# Patient Record
Sex: Male | Born: 1950 | Race: White | Hispanic: No | Marital: Single | Smoking: Former smoker
Health system: Southern US, Community
[De-identification: ages and names within clinical notes are randomized; demographics above are authoritative.]

## PROBLEM LIST (undated history)

## (undated) DIAGNOSIS — E782 Mixed hyperlipidemia: Secondary | ICD-10-CM

## (undated) DIAGNOSIS — R0989 Other specified symptoms and signs involving the circulatory and respiratory systems: Secondary | ICD-10-CM

## (undated) DIAGNOSIS — E559 Vitamin D deficiency, unspecified: Secondary | ICD-10-CM

## (undated) HISTORY — DX: Vitamin D deficiency, unspecified: E55.9

## (undated) HISTORY — PX: ORIF CLAVICLE FRACTURE: SUR924

## (undated) HISTORY — DX: Other specified symptoms and signs involving the circulatory and respiratory systems: R09.89

## (undated) HISTORY — DX: Mixed hyperlipidemia: E78.2

---
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
## 1979-01-06 HISTORY — PX: APPENDECTOMY: SHX54

## 2000-07-21 ENCOUNTER — Encounter: Payer: Self-pay | Admitting: Internal Medicine

## 2000-07-21 ENCOUNTER — Ambulatory Visit (HOSPITAL_COMMUNITY): Admission: RE | Admit: 2000-07-21 | Discharge: 2000-07-21 | Payer: Self-pay | Admitting: Internal Medicine

## 2003-09-27 ENCOUNTER — Emergency Department (HOSPITAL_COMMUNITY): Admission: EM | Admit: 2003-09-27 | Discharge: 2003-09-27 | Payer: Self-pay | Admitting: *Deleted

## 2005-07-08 IMAGING — CT CT ABDOMEN W/O CM
1 series · 15 of 32 positions shown, 19 images · IV contrast (agent unspecified)
Comparison: none

CLINICAL DATA: 53 year old male with left flank pain with nausea and vomiting for six hours.
CT SCAN OF THE ABDOMEN AND PELVIS WITHOUT IV OR ORAL CONTRAST ? (RENAL STONE PROTOCOL) ? 09/27/03
TECHNIQUE: 5 mm collimation was utilized to scan the abdomen and pelvis without intravenous or oral contrast.  
CT ABDOMEN WITHOUT CONTRAST:

[Series 2: renal stone · axial · 0.74mm/px · z∈[-464,-114]mm · 15 of 78 slices shown, 19 images]
[im 5/78  soft-tissue]
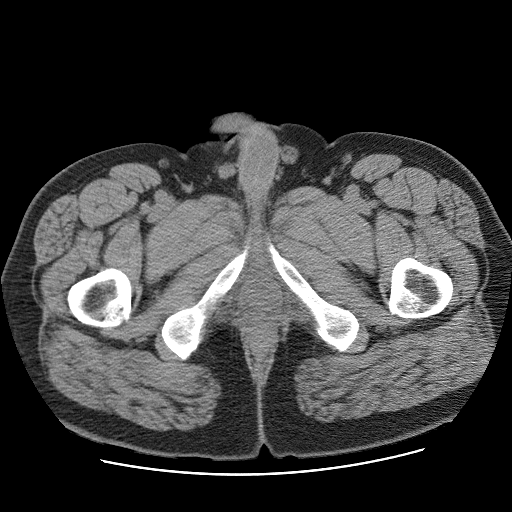
[im 5/78  bone]
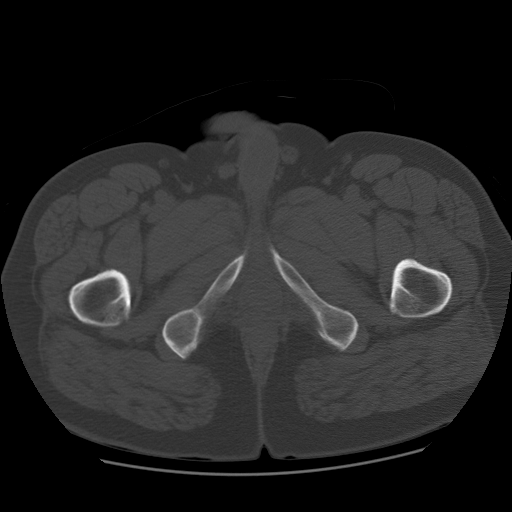
[im 10/78  soft-tissue]
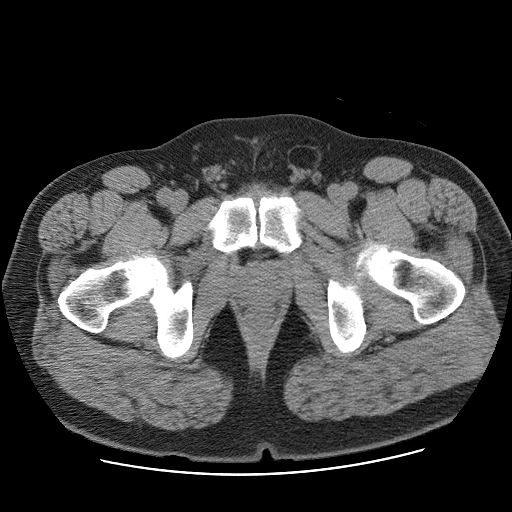
[im 15/78  soft-tissue]
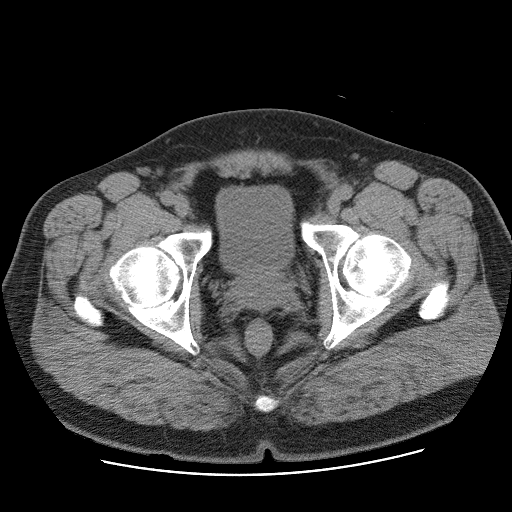
[im 23/78  soft-tissue]
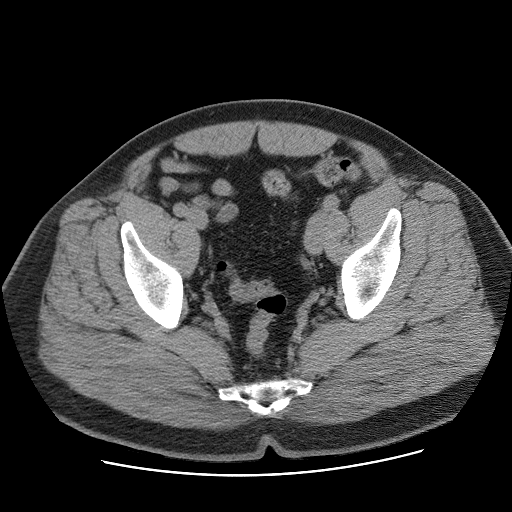
[im 28/78  soft-tissue]
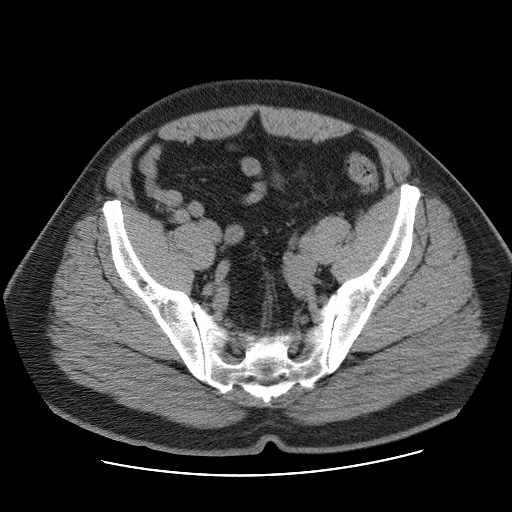
[im 33/78  soft-tissue]
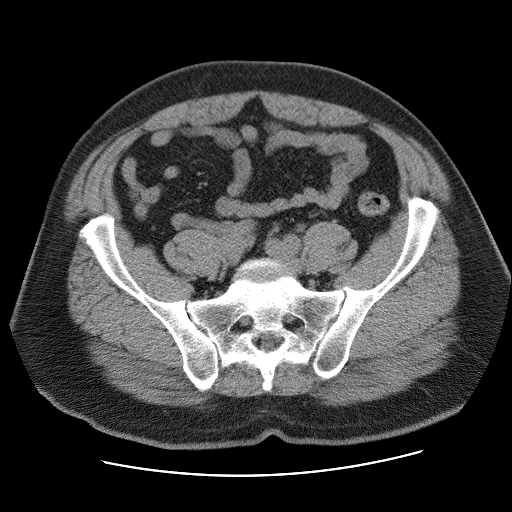
[im 40/78  soft-tissue]
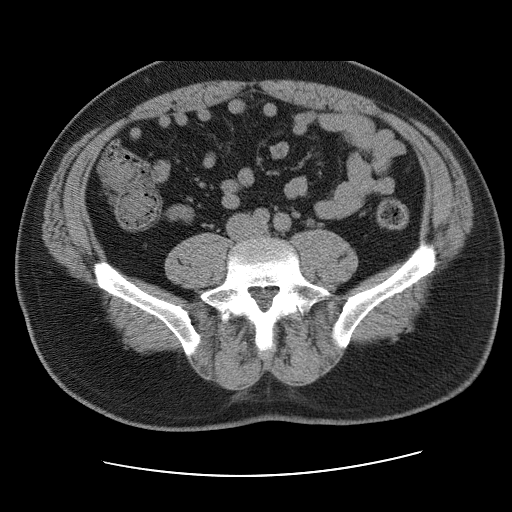
[im 45/78  soft-tissue]
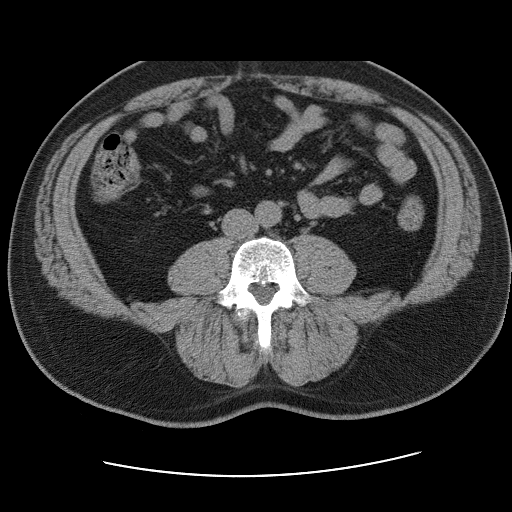
[im 50/78  soft-tissue]
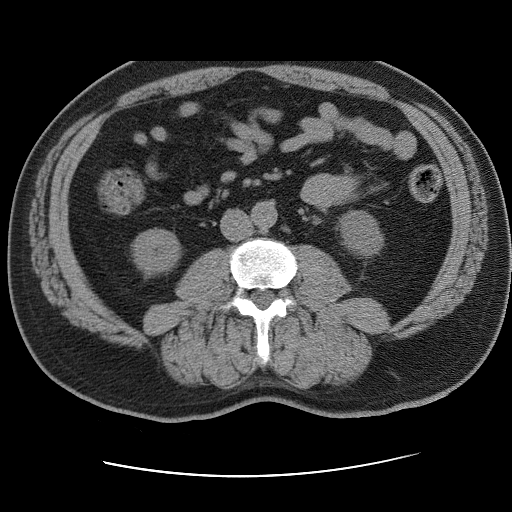
[im 50/78  bone]
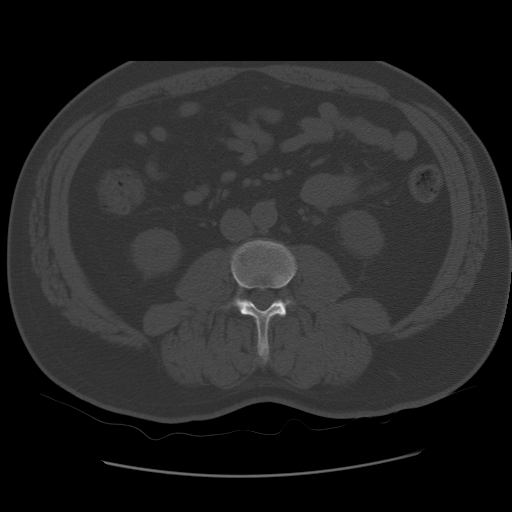
[im 55/78  soft-tissue]
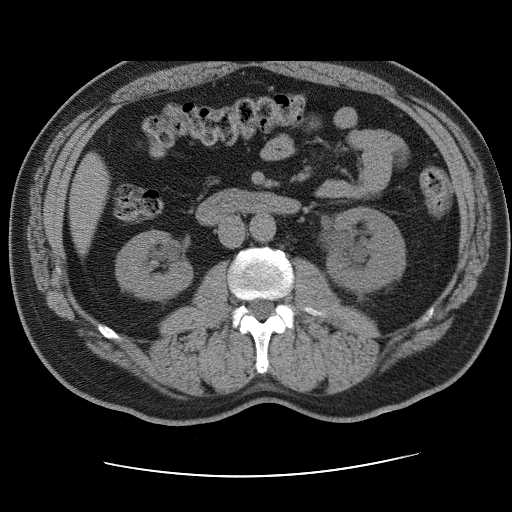
[im 63/78  soft-tissue]
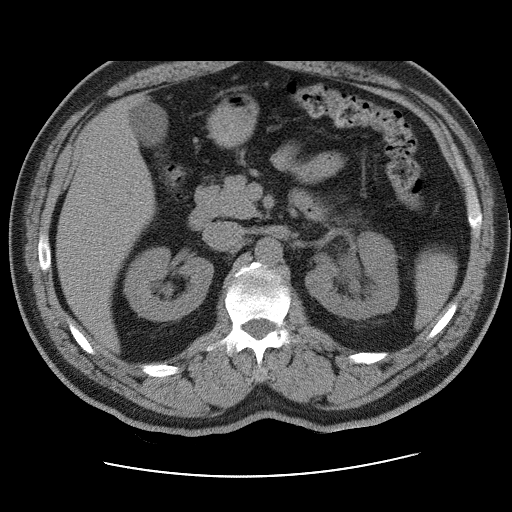
[im 68/78  soft-tissue]
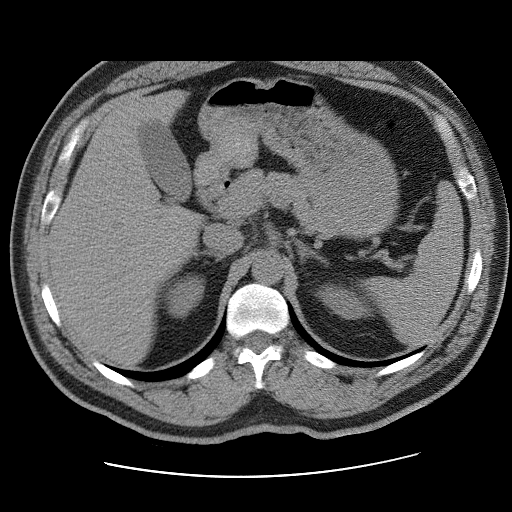
[im 68/78  lung]
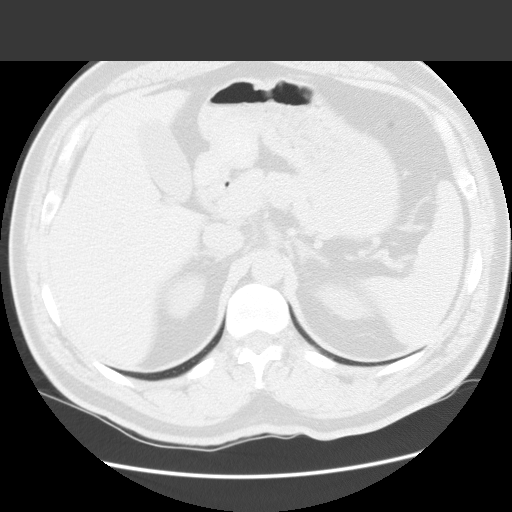
[im 70/78  lung]
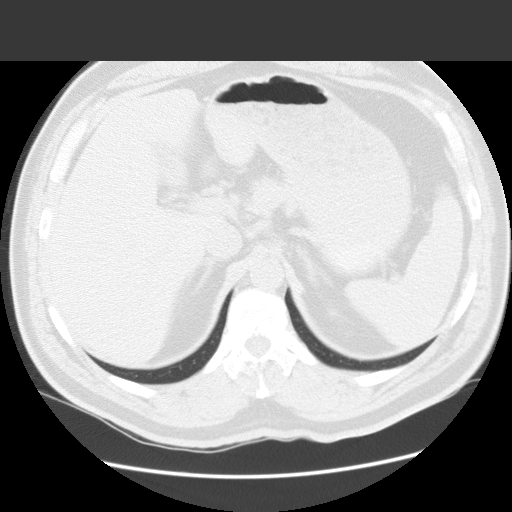
[im 73/78  soft-tissue]
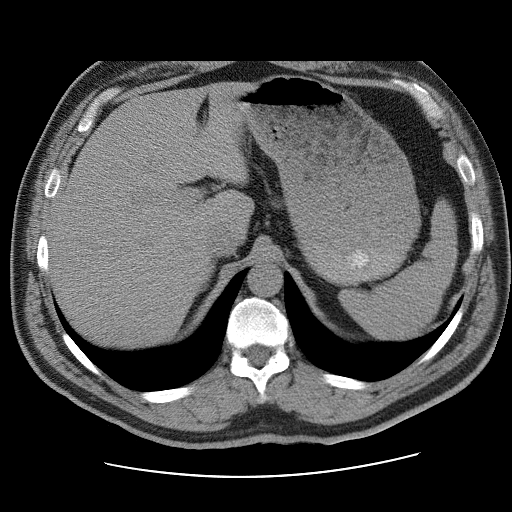
[im 73/78  lung]
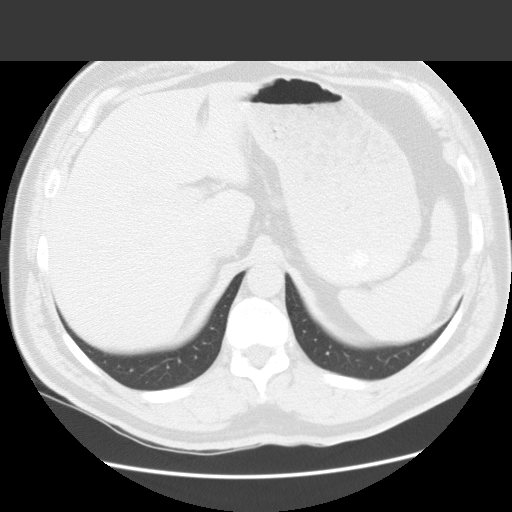
[im 75/78  lung]
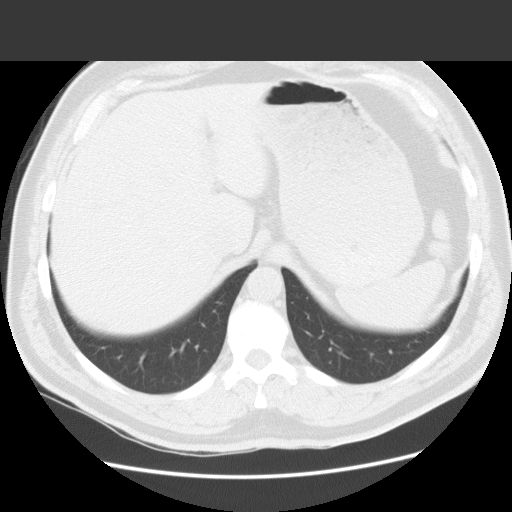

[15 of 32 positions shown; findings below may reference images not displayed]

FINDINGS: There is left hydronephrosis and mild hydroureter.  The left kidney is edematous with mild low attenuation consistent with interstitial edema.  There is mild periseptal edema within Gerota's fascia.  This is related to a distal urinary tract calculus described below.  Otherwise, there is no additional upper urinary tract calcification.  
Within the limits of a noncontrast examination, there is no other acute finding in the visualized abdomen.  The appendix is not visualized.
IMPRESSION
1.  Acute left hydronephrosis and hydroureter.  
CT PELVIS WITHOUT CONTRAST:
FINDINGS: Continuing into the pelvis, there is persistent hydroureter along the iliac vessels extending along the left pelvic sidewall with adjacent inflammation.  Within the left ureterovesical junction, along the bladder wall, there is a punctate calculus measuring 2 mm on image #62.  The location of the calculus appears to be along the bladder wall or about to pass within the bladder lumen.  Prostate calcifications also noted.  A left inguinal hernia is noted containing only fat.  
IMPRESSION
1.  Left ureterovesical junction 2 mm calculus within the bladder wall region about to pass into the bladder.  This results in the proximal obstruction described above.
2.  Left inguinal hernia containing only fat.

## 2006-07-12 ENCOUNTER — Emergency Department (HOSPITAL_COMMUNITY): Admission: EM | Admit: 2006-07-12 | Discharge: 2006-07-12 | Payer: Self-pay | Admitting: Emergency Medicine

## 2009-01-05 HISTORY — PX: REFRACTIVE SURGERY: SHX103

## 2009-05-20 ENCOUNTER — Ambulatory Visit (HOSPITAL_COMMUNITY): Admission: RE | Admit: 2009-05-20 | Discharge: 2009-05-20 | Payer: Self-pay | Admitting: Internal Medicine

## 2010-10-29 ENCOUNTER — Encounter: Payer: Self-pay | Admitting: Gastroenterology

## 2011-03-01 IMAGING — CR DG CHEST 2V
2 series · 2 of 2 positions shown · non-contrast
Comparison: Report from chest radiograph of [id number] (images no longer
available).

CLINICAL DATA: Annual physical examination.  Nonsmoker.

CHEST - 2 VIEW

[view not recorded (1 of 2)]
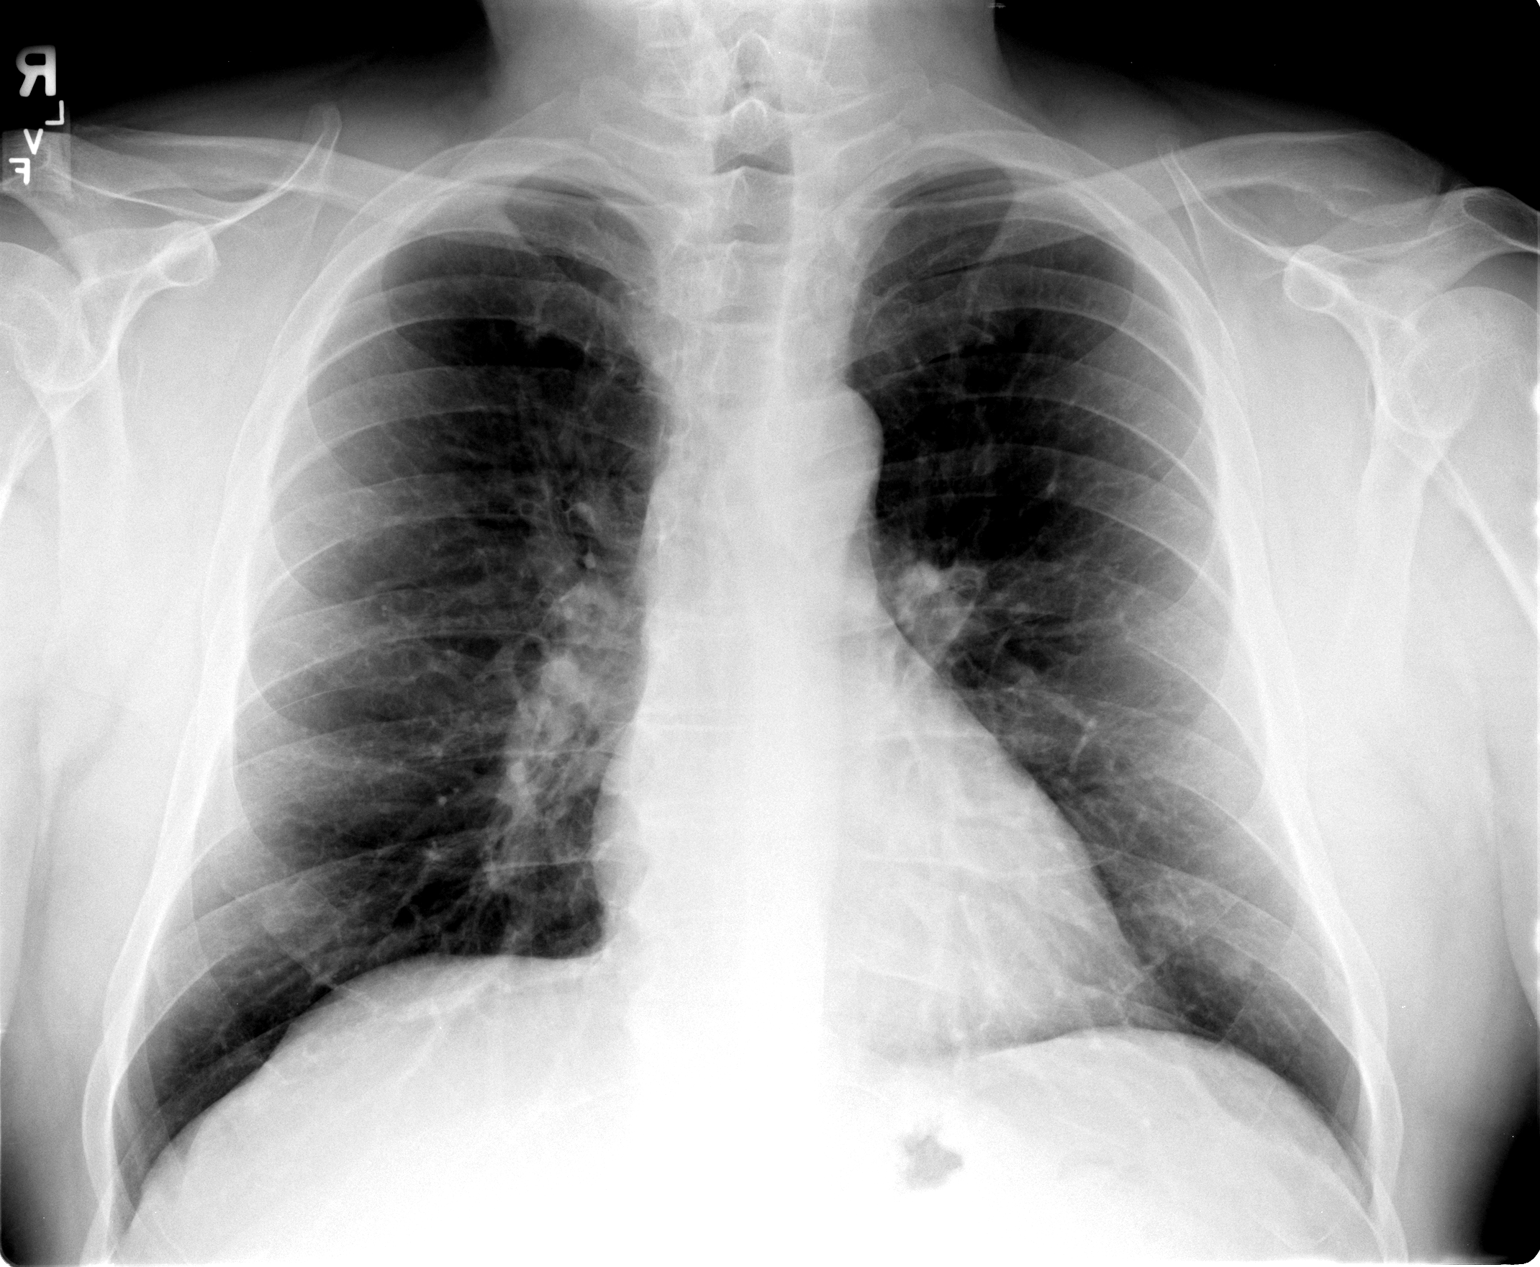

[view not recorded (2 of 2)]
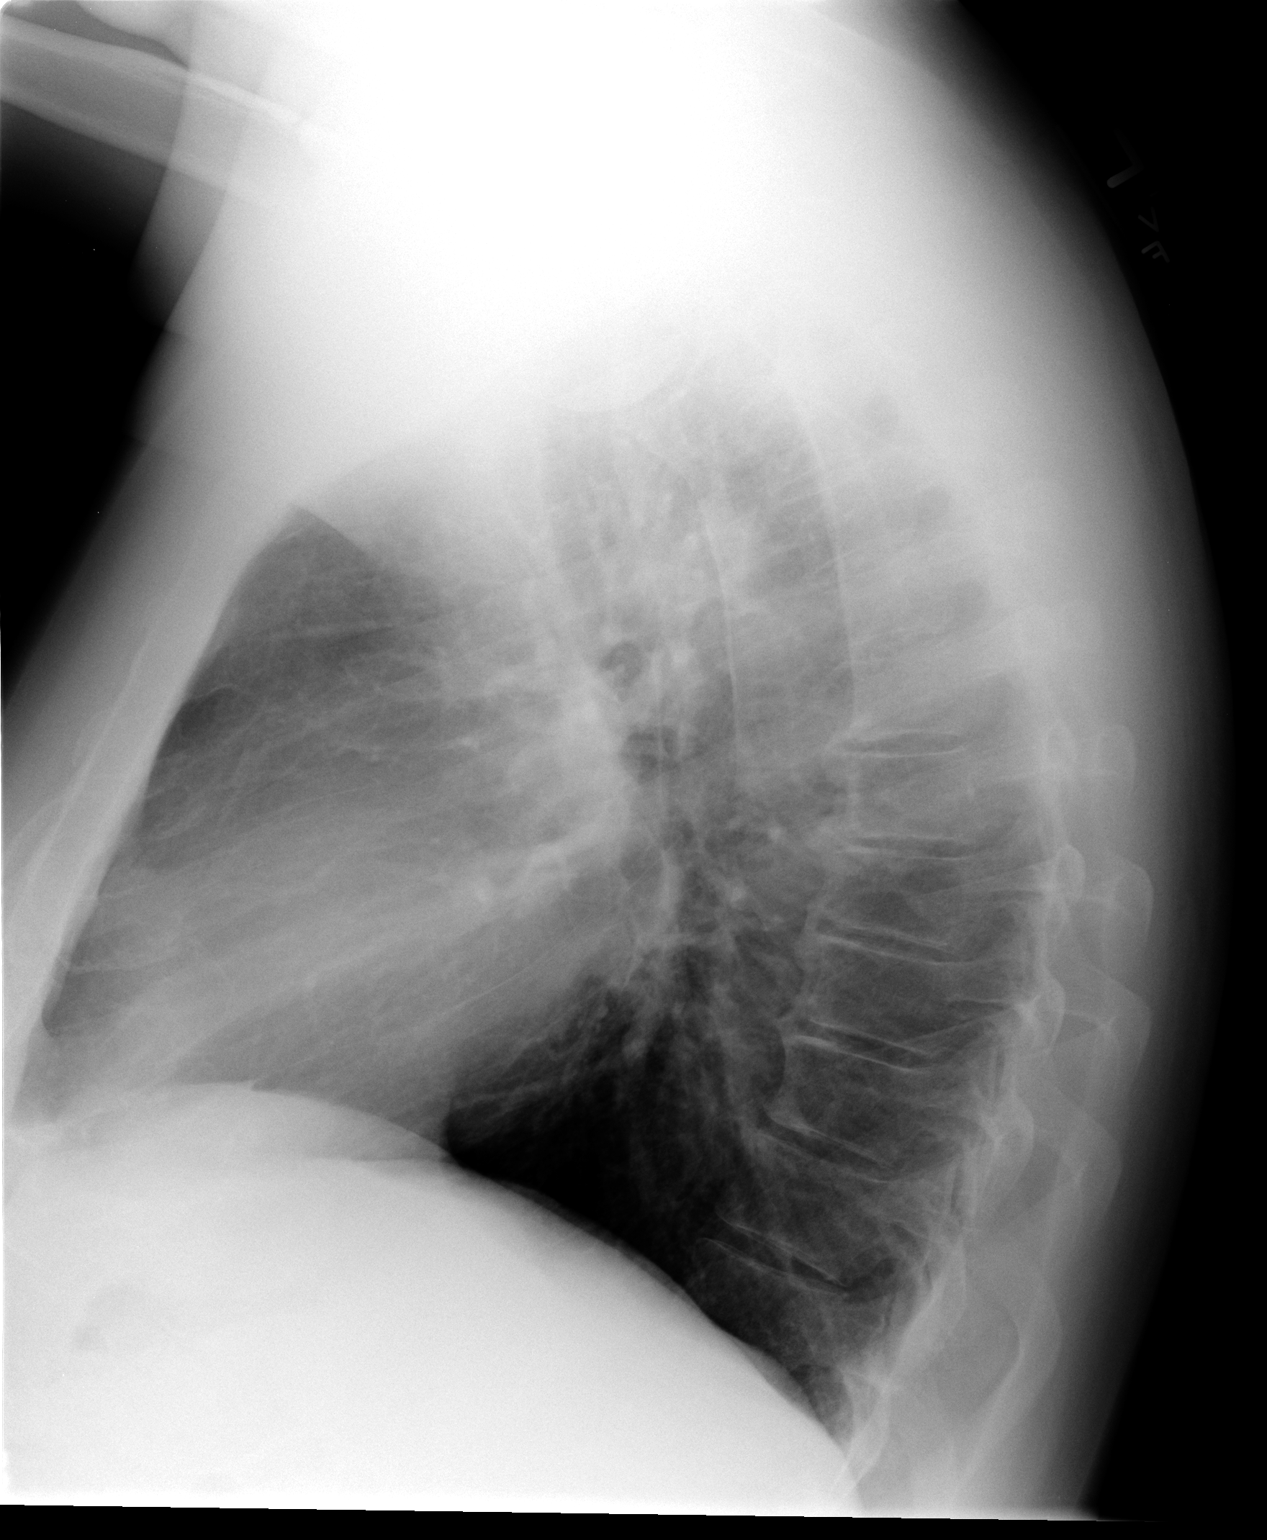

[2 of 2 positions shown; findings below may reference images not displayed]

FINDINGS: Heart and mediastinal contours are normal.  The trachea
is midline.  No airspace disease, mass, or pleural effusion is
identified.  Probable bilateral nipple shadows noted.  Degenerative
changes of the thoracic spine.  No acute bony abnormality.
IMPRESSION: 1.  No acute cardiopulmonary disease.
2.  Probable bilateral nipple shadows.  A follow-up frontal view
chest x-ray could be performed with nipple markers to confirm.

## 2011-10-01 ENCOUNTER — Encounter: Payer: Self-pay | Admitting: Gastroenterology

## 2013-06-03 DIAGNOSIS — E782 Mixed hyperlipidemia: Secondary | ICD-10-CM | POA: Insufficient documentation

## 2013-06-03 DIAGNOSIS — E559 Vitamin D deficiency, unspecified: Secondary | ICD-10-CM | POA: Insufficient documentation

## 2013-06-03 DIAGNOSIS — R0989 Other specified symptoms and signs involving the circulatory and respiratory systems: Secondary | ICD-10-CM | POA: Insufficient documentation

## 2013-06-08 ENCOUNTER — Encounter: Payer: Self-pay | Admitting: Internal Medicine

## 2013-06-08 ENCOUNTER — Ambulatory Visit (INDEPENDENT_AMBULATORY_CARE_PROVIDER_SITE_OTHER): Payer: 59 | Admitting: Internal Medicine

## 2013-06-08 VITALS — BP 112/74 | HR 68 | Temp 98.1°F | Resp 16 | Ht 68.5 in | Wt 219.4 lb

## 2013-06-08 DIAGNOSIS — Z113 Encounter for screening for infections with a predominantly sexual mode of transmission: Secondary | ICD-10-CM

## 2013-06-08 DIAGNOSIS — Z111 Encounter for screening for respiratory tuberculosis: Secondary | ICD-10-CM

## 2013-06-08 DIAGNOSIS — R7401 Elevation of levels of liver transaminase levels: Secondary | ICD-10-CM

## 2013-06-08 DIAGNOSIS — R740 Nonspecific elevation of levels of transaminase and lactic acid dehydrogenase [LDH]: Secondary | ICD-10-CM

## 2013-06-08 DIAGNOSIS — R7402 Elevation of levels of lactic acid dehydrogenase (LDH): Secondary | ICD-10-CM

## 2013-06-08 DIAGNOSIS — E66811 Obesity, class 1: Secondary | ICD-10-CM | POA: Insufficient documentation

## 2013-06-08 DIAGNOSIS — Z1212 Encounter for screening for malignant neoplasm of rectum: Secondary | ICD-10-CM

## 2013-06-08 DIAGNOSIS — Z0000 Encounter for general adult medical examination without abnormal findings: Secondary | ICD-10-CM

## 2013-06-08 DIAGNOSIS — I10 Essential (primary) hypertension: Secondary | ICD-10-CM

## 2013-06-08 DIAGNOSIS — R0989 Other specified symptoms and signs involving the circulatory and respiratory systems: Secondary | ICD-10-CM

## 2013-06-08 DIAGNOSIS — E559 Vitamin D deficiency, unspecified: Secondary | ICD-10-CM

## 2013-06-08 DIAGNOSIS — Z125 Encounter for screening for malignant neoplasm of prostate: Secondary | ICD-10-CM

## 2013-06-08 DIAGNOSIS — E669 Obesity, unspecified: Secondary | ICD-10-CM | POA: Insufficient documentation

## 2013-06-08 LAB — CBC WITH DIFFERENTIAL/PLATELET
Basophils Absolute: 0 10*3/uL (ref 0.0–0.1)
Basophils Relative: 0 % (ref 0–1)
Eosinophils Absolute: 0.1 10*3/uL (ref 0.0–0.7)
Eosinophils Relative: 1 % (ref 0–5)
HCT: 45.4 % (ref 39.0–52.0)
HEMOGLOBIN: 15.9 g/dL (ref 13.0–17.0)
LYMPHS ABS: 2.4 10*3/uL (ref 0.7–4.0)
LYMPHS PCT: 28 % (ref 12–46)
MCH: 31.1 pg (ref 26.0–34.0)
MCHC: 35 g/dL (ref 30.0–36.0)
MCV: 88.7 fL (ref 78.0–100.0)
Monocytes Absolute: 0.5 10*3/uL (ref 0.1–1.0)
Monocytes Relative: 6 % (ref 3–12)
NEUTROS PCT: 65 % (ref 43–77)
Neutro Abs: 5.5 10*3/uL (ref 1.7–7.7)
Platelets: 235 10*3/uL (ref 150–400)
RBC: 5.12 MIL/uL (ref 4.22–5.81)
RDW: 14.1 % (ref 11.5–15.5)
WBC: 8.4 10*3/uL (ref 4.0–10.5)

## 2013-06-08 LAB — HEMOGLOBIN A1C
Hgb A1c MFr Bld: 5.8 % — ABNORMAL HIGH (ref <5.7)
Mean Plasma Glucose: 120 mg/dL — ABNORMAL HIGH (ref <117)

## 2013-06-08 NOTE — Progress Notes (Signed)
Patient ID: Kenneth Cantu, male   DOB: [birth year], 63 y.o.   MRN: [id number]   Annual Screening Comprehensive Examination  This very nice 63 y.o.[name] presents for complete physical.  Patient has been followed for labile HTN, Hyperlipidemia, and Vitamin D Deficiency.   Labile HTN has been monitored expectantly since 1995. Patient's BP has been controlled at home.Today's BP: 112/74 mmHg.  Last year calculated GFR was 59 consistent with Stage 3 CKD. Patient denies any cardiac symptoms as chest pain, palpitations, shortness of breath, dizziness or ankle swelling.   Patient's mild hyperlipidemia is controlled with diet and patient has been reluctant to take any medications as statins for lipid lowering. Last cholesterol last visit was 208, triglycerides 191, HDL 41 and LDL 129.     Patient is screened forprediabetes and insulin resistance and last A1c was 5.4% in May 2014. Patient denies reactive hypoglycemic symptoms, visual blurring, diabetic polys or paresthesias.    Finally, patient has history of Vitamin D Deficiency of 43 in 2011 and last vitamin D 46 in May 2014.  No current meds or supplements   No Known Allergies  Past Medical History  Diagnosis Date  . Mixed hyperlipidemia   . Labile hypertension   . Vitamin D deficiency    Past Surgical History  Procedure Laterality Date  . Appendectomy  1981  . Refractive surgery Bilateral 2011  . Wrist fracture surgery Left 1970's  . Orif clavicle fracture Left 1970s   Family History  Problem Relation Age of Onset  . Thyroid disease Mother   . Hypertension Father   . Benign prostatic hyperplasia Father   . Hypertension Brother   . Heart disease Brother   . Cancer Other     lung  . Seizures Other    History   Social History  . Marital Status: Has 80 yr live-in male domestic partner    Spouse Name: N/A    Number of Children: N/A  . Years of Education: N/A   Occupational History  . Retired EMT/Fireman   Social History Main  Topics  . Smoking status: Former Games developer  . Smokeless tobacco: Not on file  . Alcohol Use: No  . Drug Use: No  . Sexual Activity: Yes    ROS Constitutional: Denies fever, chills, weight loss/gain, headaches, insomnia, fatigue, night sweats or change in appetite. Eyes: Denies redness, blurred vision, diplopia, discharge, itchy or watery eyes.  ENT: Denies discharge, congestion, post nasal drip, epistaxis, sore throat, earache, hearing loss, dental pain, Tinnitus, Vertigo, Sinus pain or snoring.  Cardio: Denies chest pain, palpitations, irregular heartbeat, syncope, dyspnea, diaphoresis, orthopnea, PND, claudication or edema Respiratory: denies cough, dyspnea, DOE, pleurisy, hoarseness, laryngitis or wheezing.  Gastrointestinal: Denies dysphagia, heartburn, reflux, water brash, pain, cramps, nausea, vomiting, bloating, diarrhea, constipation, hematemesis, melena, hematochezia, jaundice or hemorrhoids Genitourinary: Denies dysuria, frequency, urgency, nocturia, hesitancy, discharge, hematuria or flank pain Musculoskeletal: Denies arthralgia, myalgia, stiffness, Jt. Swelling, pain, limp or strain/sprain. Skin: Denies puritis, rash, hives, warts, acne, eczema or change in skin lesion Neuro: No weakness, tremor, incoordination, spasms, paresthesia or pain Psychiatric: Denies confusion, memory loss or sensory loss Endocrine: Denies change in weight, skin, hair change, nocturia, and paresthesia, diabetic polys, visual blurring or hyper / hypo glycemic episodes.  Heme/Lymph: No excessive bleeding, bruising or enlarged lymph nodes.  Physical Exam  BP 112/74  Pulse 68  Temp(Src) 98.1 F (36.7 C) (Temporal)  Resp 16  Ht 5' 8.5" (1.74 m)  Wt 219 lb 6.4 oz (99.519 kg)  BMI 32.87 kg/m2  General Appearance: Well nourished, in no apparent distress. Eyes: PERRLA, EOMs, conjunctiva no swelling or erythema, normal fundi and vessels. Sinuses: No frontal/maxillary tenderness ENT/Mouth: EACs patent / TMs   nl. Nares clear without erythema, swelling, mucoid exudates. Oral hygiene is good. No erythema, swelling, or exudate. Tongue normal, non-obstructing. Tonsils not swollen or erythematous. Hearing normal.  Neck: Supple, thyroid normal. No bruits, nodes or JVD. Respiratory: Respiratory effort normal.  BS equal and clear bilateral without rales, rhonci, wheezing or stridor. Cardio: Heart sounds are normal with regular rate and rhythm and no murmurs, rubs or gallops. Peripheral pulses are normal and equal bilaterally without edema. No aortic or femoral bruits. Chest: symmetric with normal excursions and percussion.  Abdomen: Flat, soft, with bowl sounds. Nontender, no guarding, rebound, hernias, masses, or organomegaly.  Lymphatics: Non tender without lymphadenopathy.  Genitourinary: No hernias.Testes nl. DRE - prostate nl for age - smooth & firm w/o nodules. Musculoskeletal: Full ROM all peripheral extremities, joint stability, 5/5 strength, and normal gait. Skin: Warm and dry without rashes, lesions, cyanosis, clubbing or  ecchymosis.  Neuro: Cranial nerves intact, reflexes equal bilaterally. Normal muscle tone, no cerebellar symptoms. Sensation intact.  Pysch: Awake and oriented X 3, normal affect, insight and judgment appropriate.   Assessment and Plan  1. Annual Screening Examination 2. Hypertension  3. Hyperlipidemia 4. Vitamin D Deficiency 5. Obesity Continue prudent diet as discussed, weight control, BP monitoring, regular exercise, and medications as discussed.  Discussed med effects and SE's. Routine screening labs and tests as requested with regular follow-up as recommended.

## 2013-06-08 NOTE — Patient Instructions (Signed)

## 2013-06-09 LAB — MICROALBUMIN / CREATININE URINE RATIO
Creatinine, Urine: 194.4 mg/dL
MICROALB/CREAT RATIO: 2.6 mg/g (ref 0.0–30.0)
Microalb, Ur: 0.5 mg/dL (ref 0.00–1.89)

## 2013-06-09 LAB — BASIC METABOLIC PANEL WITH GFR
BUN: 12 mg/dL (ref 6–23)
CALCIUM: 9.3 mg/dL (ref 8.4–10.5)
CHLORIDE: 103 meq/L (ref 96–112)
CO2: 23 meq/L (ref 19–32)
Creat: 1.03 mg/dL (ref 0.50–1.35)
GFR, Est African American: 89 mL/min
GFR, Est Non African American: 77 mL/min
Glucose, Bld: 83 mg/dL (ref 70–99)
POTASSIUM: 4.1 meq/L (ref 3.5–5.3)
SODIUM: 139 meq/L (ref 135–145)

## 2013-06-09 LAB — URINALYSIS, MICROSCOPIC ONLY
BACTERIA UA: NONE SEEN
CRYSTALS: NONE SEEN
Casts: NONE SEEN
Squamous Epithelial / LPF: NONE SEEN

## 2013-06-09 LAB — HEPATIC FUNCTION PANEL
ALK PHOS: 47 U/L (ref 39–117)
ALT: 10 U/L (ref 0–53)
AST: 13 U/L (ref 0–37)
Albumin: 4.1 g/dL (ref 3.5–5.2)
BILIRUBIN TOTAL: 0.6 mg/dL (ref 0.2–1.2)
Bilirubin, Direct: 0.1 mg/dL (ref 0.0–0.3)
Indirect Bilirubin: 0.5 mg/dL (ref 0.2–1.2)
Total Protein: 6.9 g/dL (ref 6.0–8.3)

## 2013-06-09 LAB — LIPID PANEL
CHOL/HDL RATIO: 5.7 ratio
Cholesterol: 243 mg/dL — ABNORMAL HIGH (ref 0–200)
HDL: 43 mg/dL (ref >39)
LDL Cholesterol: 169 mg/dL — ABNORMAL HIGH (ref 0–99)
Triglycerides: 153 mg/dL — ABNORMAL HIGH (ref <150)
VLDL: 31 mg/dL (ref 0–40)

## 2013-06-09 LAB — VITAMIN B12: VITAMIN B 12: 327 pg/mL (ref 211–911)

## 2013-06-09 LAB — HEPATITIS C ANTIBODY: HCV Ab: NEGATIVE

## 2013-06-09 LAB — HEPATITIS B CORE ANTIBODY, TOTAL: Hep B Core Total Ab: NONREACTIVE

## 2013-06-09 LAB — RPR

## 2013-06-09 LAB — TESTOSTERONE: Testosterone: 284 ng/dL — ABNORMAL LOW (ref 300–890)

## 2013-06-09 LAB — HIV ANTIBODY (ROUTINE TESTING W REFLEX): HIV: NONREACTIVE

## 2013-06-09 LAB — VITAMIN D 25 HYDROXY (VIT D DEFICIENCY, FRACTURES): VIT D 25 HYDROXY: 48 ng/mL (ref 30–89)

## 2013-06-09 LAB — PSA: PSA: 2.6 ng/mL (ref <=4.00)

## 2013-06-09 LAB — TSH: TSH: 1.424 u[IU]/mL (ref 0.350–4.500)

## 2013-06-09 LAB — INSULIN, FASTING: Insulin fasting, serum: 9 u[IU]/mL (ref 3–28)

## 2013-06-09 LAB — HEPATITIS B SURFACE ANTIBODY,QUALITATIVE

## 2013-06-09 LAB — MAGNESIUM: MAGNESIUM: 2 mg/dL (ref 1.5–2.5)

## 2013-06-09 LAB — HEPATITIS A ANTIBODY, TOTAL: HEP A TOTAL AB: NONREACTIVE

## 2013-06-12 LAB — TB SKIN TEST
Induration: 0 mm
TB Skin Test: NEGATIVE

## 2013-06-12 LAB — HEPATITIS B E ANTIBODY: Hepatitis Be Antibody: NONREACTIVE

## 2013-06-14 ENCOUNTER — Telehealth: Payer: Self-pay | Admitting: Internal Medicine

## 2013-06-14 ENCOUNTER — Other Ambulatory Visit (INDEPENDENT_AMBULATORY_CARE_PROVIDER_SITE_OTHER): Payer: 59 | Admitting: *Deleted

## 2013-06-14 DIAGNOSIS — Z1212 Encounter for screening for malignant neoplasm of rectum: Secondary | ICD-10-CM

## 2013-06-14 LAB — POC HEMOCCULT BLD/STL (HOME/3-CARD/SCREEN)
Card #2 Fecal Occult Blod, POC: NEGATIVE
Card #3 Fecal Occult Blood, POC: NEGATIVE
Fecal Occult Blood, POC: NEGATIVE

## 2013-06-14 NOTE — Telephone Encounter (Signed)
CONFIRMING APPT

## 2013-08-10 ENCOUNTER — Ambulatory Visit (INDEPENDENT_AMBULATORY_CARE_PROVIDER_SITE_OTHER): Payer: 59 | Admitting: Physician Assistant

## 2013-08-10 ENCOUNTER — Encounter: Payer: Self-pay | Admitting: Physician Assistant

## 2013-08-10 VITALS — BP 120/70 | HR 60 | Temp 98.6°F | Resp 16 | Ht 68.5 in | Wt 210.0 lb

## 2013-08-10 DIAGNOSIS — L97509 Non-pressure chronic ulcer of other part of unspecified foot with unspecified severity: Secondary | ICD-10-CM

## 2013-08-10 DIAGNOSIS — L97511 Non-pressure chronic ulcer of other part of right foot limited to breakdown of skin: Secondary | ICD-10-CM

## 2013-08-10 MED ORDER — MUPIROCIN CALCIUM 2 % EX CREA: TOPICAL_CREAM | CUTANEOUS | Status: DC

## 2013-08-10 NOTE — Patient Instructions (Signed)

## 2013-08-10 NOTE — Progress Notes (Signed)
   Subjective:    Patient ID: Kenneth Cantu, male    DOB: 03-02-1950, 63 y.o.   MRN: [id number]  HPI 63 y.o. nonsmoking male with history of preDM, HTN presents with cut/ulcer on right medial 3rd toe. 1 week ago his nail on his 4th toe rubbed/cut into the side of his 3rd toe. He started to notice the redness last pain, denies pain. He has not been putting anything on it.  Lab Results  Component Value Date   HGBA1C 5.8* 06/08/2013   Review of Systems  Constitutional: Negative.   HENT: Negative.   Eyes: Negative.   Cardiovascular: Negative.   Gastrointestinal: Negative.   Genitourinary: Negative.   Musculoskeletal: Negative.   Skin: Positive for rash and wound. Negative for color change and pallor.  Hematological: Negative.       Objective:   Physical Exam  Constitutional: He is oriented to person, place, and time. He appears well-developed and well-nourished.  Cardiovascular: Normal rate and regular rhythm.   Pulmonary/Chest: Effort normal and breath sounds normal.  Abdominal: Soft. Bowel sounds are normal.  Musculoskeletal: Normal range of motion.  Neurological: He is alert and oriented to person, place, and time.  Sensation intact  Skin: Skin is warm and dry.  Right medial 3rd toe with slight erythema, and dry scaly ulcer without warmth, tenderness or exudate. Intact pulses, intact sensation.        Assessment & Plan:  Ulceration without infection- ABX cream sent in, elevate, rest, call if redness spreads, gets warm, or painful.

## 2013-09-14 ENCOUNTER — Encounter: Payer: Self-pay | Admitting: Internal Medicine

## 2013-09-14 ENCOUNTER — Ambulatory Visit (INDEPENDENT_AMBULATORY_CARE_PROVIDER_SITE_OTHER): Payer: 59 | Admitting: Internal Medicine

## 2013-09-14 VITALS — BP 102/60 | HR 62 | Temp 98.4°F | Resp 16 | Ht 68.5 in | Wt 207.0 lb

## 2013-09-14 DIAGNOSIS — E291 Testicular hypofunction: Secondary | ICD-10-CM

## 2013-09-14 DIAGNOSIS — R7309 Other abnormal glucose: Secondary | ICD-10-CM

## 2013-09-14 DIAGNOSIS — R0989 Other specified symptoms and signs involving the circulatory and respiratory systems: Secondary | ICD-10-CM

## 2013-09-14 DIAGNOSIS — E782 Mixed hyperlipidemia: Secondary | ICD-10-CM

## 2013-09-14 DIAGNOSIS — I10 Essential (primary) hypertension: Secondary | ICD-10-CM

## 2013-09-14 DIAGNOSIS — E559 Vitamin D deficiency, unspecified: Secondary | ICD-10-CM

## 2013-09-14 NOTE — Patient Instructions (Signed)
Insulin Resistance Blood sugar (glucose) levels are controlled by a hormone called insulin. Insulin is made by your pancreas. When your blood glucose goes up, insulin is released into your blood. Insulin is required for your body to function normally. However, your body can become resistant to your own insulin or to insulin given to treat diabetes. In either case, insulin resistance can lead to serious problems. These problems include:  Type 2 diabetes.  Heart disease.  High blood pressure.  Stroke.  Polycystic ovary syndrome.  Fatty liver. CAUSES  Insulin resistance can develop for many different reasons. It is more likely to happen in people with these conditions or characteristics:  Obesity.  Inactivity.  Pregnancy.  High blood pressure.  Stress.  Steroid use.  Infection or severe illness.  Increased levels of cholesterol and triglycerides. SYMPTOMS  There are no symptoms. You may have symptoms related to the various complications of insulin resistance.  DIAGNOSIS  Several different things can make your caregiver suspect you have insulin resistance. These include:  High blood glucose (hyperglycemia).  Abnormal cholesterol levels.  High uric acid levels.  Changes related to blood pressure.  Changes related to inflammation. Insulin resistance can be determined with blood tests. An elevated insulin level when you have not eaten might suggest resistance. Other more complicated tests are sometimes necessary. TREATMENT  Lifestyle changes are the most important treatment for insulin resistance.   If you are overweight and you have insulin resistance, you can improve your insulin sensitivity by losing weight.  Moderate exercise for 30-40 minutes, 4 days a week, can improve insulin sensitivity. Some medicines can also help improve your insulin sensitivity. Your caregiver can discuss these with you if they are appropriate.  HOME CARE INSTRUCTIONS   Do not  smoke.  Keep your weight at a healthy level.  Get exercise.  If you have diabetes, follow your caregiver's directions.  If you have high blood pressure, follow your caregiver's directions.  Only take prescription medicines for pain, fever, or discomfort as directed by your caregiver. SEEK MEDICAL CARE IF:   You are diabetic and you are having problems keeping your blood glucose levels at target range.  You are having episodes of low blood glucose (hypoglycemia).  You feel you might be having side effects from your medicines.  You have symptoms of an illness that is not improving after 3-4 days.  You have a sore or wound that is not healing.  You notice a change in vision or a new problem with your vision. SEEK IMMEDIATE MEDICAL CARE IF:   Your blood glucose goes below 70, especially if you have confusion, lightheadedness, or other symptoms with it.  Your blood glucose is very high (as advised by your caregiver) twice in a row.  You pass out.  You have chest pain or trouble breathing.  You have a sudden, severe headache.  You have sudden weakness in one arm or one leg.  You have sudden difficulty speaking or swallowing.  You develop vomiting or diarrhea that is getting worse or not improving after 1 day. Document Released: 02/10/2005 Document Revised: 06/23/2011 Document Reviewed: 06/02/2012 [facility] Patient Information 2015 [place], [place]. This information is not intended to replace advice given to you by your health care provider. Make sure you discuss any questions you have with your health care provider.    Vitamin D Deficiency Vitamin D is an important vitamin that your body needs. Having too little of it in your body is called a deficiency. A very  bad deficiency can make your bones soft and can cause a condition called rickets.  Vitamin D is important to your body for different reasons, such as:   It helps your body absorb 2 minerals called calcium and  phosphorus.  It helps make your bones healthy.  It may prevent some diseases, such as diabetes and multiple sclerosis.  It helps your muscles and heart. You can get vitamin D in several ways. It is a natural part of some foods. The vitamin is also added to some dairy products and cereals. Some people take vitamin D supplements. Also, your body makes vitamin D when you are in the sun. It changes the sun's rays into a form of the vitamin that your body can use. CAUSES   Not eating enough foods that contain vitamin D.  Not getting enough sunlight.  Having certain digestive system diseases that make it hard to absorb vitamin D. These diseases include Crohn's disease, chronic pancreatitis, and cystic fibrosis.  Having a surgery in which part of the stomach or small intestine is removed.  Being obese. Fat cells pull vitamin D out of your blood. That means that obese people may not have enough vitamin D left in their blood and in other body tissues.  Having chronic kidney or liver disease. RISK FACTORS Risk factors are things that make you more likely to develop a vitamin D deficiency. They include:  Being older.  Not being able to get outside very much.  Living in a nursing home.  Having had broken bones.  Having weak or thin bones (osteoporosis).  Having a disease or condition that changes how your body absorbs vitamin D.  Having dark skin.  Some medicines such as seizure medicines or steroids.  Being overweight or obese. SYMPTOMS Mild cases of vitamin D deficiency may not have any symptoms. If you have a very bad case, symptoms may include:  Bone pain.  Muscle pain.  Falling often.  Broken bones caused by a minor injury, due to osteoporosis. DIAGNOSIS A blood test is the best way to tell if you have a vitamin D deficiency. TREATMENT Vitamin D deficiency can be treated in different ways. Treatment for vitamin D deficiency depends on what is causing it. Options  include:  Taking vitamin D supplements.  Taking a calcium supplement. Your caregiver will suggest what dose is best for you. HOME CARE INSTRUCTIONS  Take any supplements that your caregiver prescribes. Follow the directions carefully. Take only the suggested amount.  Have your blood tested 2 months after you start taking supplements.  Eat foods that contain vitamin D. Healthy choices include:  Fortified dairy products, cereals, or juices. Fortified means vitamin D has been added to the food. Check the label on the package to be sure.  Fatty fish like salmon or trout.  Eggs.  Oysters.  Do not use a tanning bed.  Keep your weight at a healthy level. Lose weight if you need to.  Keep all follow-up appointments. Your caregiver will need to perform blood tests to make sure your vitamin D deficiency is going away. SEEK MEDICAL CARE IF:  You have any questions about your treatment.  You continue to have symptoms of vitamin D deficiency.  You have nausea or vomiting.  You are constipated.  You feel confused.  You have severe abdominal or back pain. MAKE SURE YOU:  Understand these instructions.  Will watch your condition.  Will get help right away if you are not doing well or get worse.  Document Released: 03/16/2011 Document Revised: 04/18/2012 Document Reviewed: 03/16/2011 [facility] Patient Information 2015 [place], [place]. This information is not intended to replace advice given to you by your health care provider. Make sure you discuss any questions you have with your health care provider.

## 2013-09-14 NOTE — Progress Notes (Signed)
Patient ID: Kenneth Cantu, male   DOB: Oct 21, 1950, 63 y.o.   MRN: [id number]   This very nice 63 y.o.male presents for follow up with Hx/o elevated BP, Hyperlipidemia, Pre-Diabetes and Vitamin D Deficiency.    Patient is monitored expectantly  for HTN & BP has been controlled and today's BP: 102/60 mmHg. Patient denies any cardiac type chest pain, palpitations, dyspnea/orthopnea/PND, dizziness, claudication, or dependent edema.   Hyperlipidemia is  not controlled with diet &  Patient adamantly declines taking meds for cholesterol. Last Lipids werenot at goal - Total Chol 243; HDL 43; LDL 169; Trig 153 on 06/08/2013.   Also, the patient has history of PreDiabetes and patient denies any symptoms of reactive hypoglycemia, diabetic polys, paresthesias or visual blurring.  Last A1c was  5.8% on 06/08/2013.    Further, Patient has history of Vitamin D Deficiency and patient supplements vitamin D without any suspected side-effects. Last vitamin D was  48 on 06/08/2013.   Medication List   MULTIVITAMIN & MINERAL PO  Take by mouth daily.     Vitamin D 2000 UNITS tablet  Take 4,000 Units by mouth daily.     No Known Allergies  PMHx:   Past Medical History  Diagnosis Date  . Mixed hyperlipidemia   . Labile hypertension   . Vitamin D deficiency    FHx:    Reviewed / unchanged SHx:    Reviewed / unchanged  Systems Review:  Constitutional: Denies fever, chills, wt changes, headaches, insomnia, fatigue, night sweats, change in appetite. Eyes: Denies redness, blurred vision, diplopia, discharge, itchy, watery eyes.  ENT: Denies discharge, congestion, post nasal drip, epistaxis, sore throat, earache, hearing loss, dental pain, tinnitus, vertigo, sinus pain, snoring.  CV: Denies chest pain, palpitations, irregular heartbeat, syncope, dyspnea, diaphoresis, orthopnea, PND, claudication or edema. Respiratory: denies cough, dyspnea, DOE, pleurisy, hoarseness, laryngitis, wheezing.  Gastrointestinal:  Denies dysphagia, odynophagia, heartburn, reflux, water brash, abdominal pain or cramps, nausea, vomiting, bloating, diarrhea, constipation, hematemesis, melena, hematochezia  or hemorrhoids. Genitourinary: Denies dysuria, frequency, urgency, nocturia, hesitancy, discharge, hematuria or flank pain. Musculoskeletal: Denies arthralgias, myalgias, stiffness, jt. swelling, pain, limping or strain/sprain.  Skin: Denies pruritus, rash, hives, warts, acne, eczema or change in skin lesion(s). Neuro: No weakness, tremor, incoordination, spasms, paresthesia or pain. Psychiatric: Denies confusion, memory loss or sensory loss. Endo: Denies change in weight, skin or hair change.  Heme/Lymph: No excessive bleeding, bruising or enlarged lymph nodes.  Exam:  BP 102/60  Pulse 62  Temp(Src) 98.4 F (36.9 C) (Temporal)  Resp 16  Ht 5' 8.5" (1.74 m)  Wt 207 lb (93.895 kg)  BMI 31.01 kg/m2  Appears well nourished and in no distress. Eyes: PERRLA, EOMs, conjunctiva no swelling or erythema. Sinuses: No frontal/maxillary tenderness ENT/Mouth: EAC's clear, TM's nl w/o erythema, bulging. Nares clear w/o erythema, swelling, exudates. Oropharynx clear without erythema or exudates. Oral hygiene is good. Tongue normal, non obstructing. Hearing intact.  Neck: Supple. Thyroid nl. Car 2+/2+ without bruits, nodes or JVD. Chest: Respirations nl with BS clear & equal w/o rales, rhonchi, wheezing or stridor.  Cor: Heart sounds normal w/ regular rate and rhythm without sig. murmurs, gallops, clicks, or rubs. Peripheral pulses normal and equal  without edema.  Abdomen: Soft & bowel sounds normal. Non-tender w/o guarding, rebound, hernias, masses, or organomegaly.  Lymphatics: Unremarkable.  Musculoskeletal: Full ROM all peripheral extremities, joint stability, 5/5 strength, and normal gait.  Skin: Warm, dry without exposed rashes, lesions or ecchymosis apparent.  Neuro: Cranial nerves intact,  reflexes equal bilaterally.  Sensory-motor testing grossly intact. Tendon reflexes grossly intact.  Pysch: Alert & oriented x 3.  Insight and judgement nl & appropriate. No ideations.   Assessment and Plan:  1. Elev BP, HX - Continue monitor blood pressure at home. Continue diet/meds same.  2. Hyperlipidemia - Continue diet/meds, exercise,& lifestyle modifications. Continue monitor periodic cholesterol/liver & renal functions   3. Pre-Diabetes - Continue diet, exercise, lifestyle modifications. Monitor appropriate labs.  3. T2_NIDDM -    4. Vitamin D Deficiency - Continue supplementation.  Recommended regular exercise, BP monitoring, weight control, and discussed med and SE's. Recommended labs to assess and monitor clinical status. Further disposition pending results of labs.

## 2013-09-15 LAB — VITAMIN D 25 HYDROXY (VIT D DEFICIENCY, FRACTURES): VIT D 25 HYDROXY: 66 ng/mL (ref 30–89)

## 2013-09-15 LAB — HEMOGLOBIN A1C
Hgb A1c MFr Bld: 5.8 % — ABNORMAL HIGH (ref <5.7)
MEAN PLASMA GLUCOSE: 120 mg/dL — AB (ref <117)

## 2013-09-15 LAB — TESTOSTERONE: Testosterone: 190 ng/dL — ABNORMAL LOW (ref 300–890)

## 2014-06-11 ENCOUNTER — Encounter: Payer: Self-pay | Admitting: Internal Medicine

## 2014-06-11 ENCOUNTER — Ambulatory Visit: Payer: 59 | Admitting: Internal Medicine

## 2014-06-11 VITALS — BP 130/76 | HR 64 | Temp 97.7°F | Resp 16 | Ht 68.75 in | Wt 218.0 lb

## 2014-06-11 DIAGNOSIS — E349 Endocrine disorder, unspecified: Secondary | ICD-10-CM

## 2014-06-11 DIAGNOSIS — R0989 Other specified symptoms and signs involving the circulatory and respiratory systems: Secondary | ICD-10-CM

## 2014-06-11 DIAGNOSIS — Z1212 Encounter for screening for malignant neoplasm of rectum: Secondary | ICD-10-CM

## 2014-06-11 DIAGNOSIS — E559 Vitamin D deficiency, unspecified: Secondary | ICD-10-CM

## 2014-06-11 DIAGNOSIS — Z111 Encounter for screening for respiratory tuberculosis: Secondary | ICD-10-CM

## 2014-06-11 DIAGNOSIS — R7303 Prediabetes: Secondary | ICD-10-CM

## 2014-06-11 DIAGNOSIS — Z125 Encounter for screening for malignant neoplasm of prostate: Secondary | ICD-10-CM

## 2014-06-11 DIAGNOSIS — Z23 Encounter for immunization: Secondary | ICD-10-CM

## 2014-06-11 DIAGNOSIS — E782 Mixed hyperlipidemia: Secondary | ICD-10-CM

## 2014-06-11 DIAGNOSIS — R5383 Other fatigue: Secondary | ICD-10-CM

## 2014-06-11 DIAGNOSIS — Z79899 Other long term (current) drug therapy: Secondary | ICD-10-CM

## 2014-06-11 HISTORY — DX: Endocrine disorder, unspecified: E34.9

## 2014-06-11 HISTORY — DX: Prediabetes: R73.03

## 2014-06-11 LAB — CBC WITH DIFFERENTIAL/PLATELET
Basophils Absolute: 0 10*3/uL (ref 0.0–0.1)
Basophils Relative: 0 % (ref 0–1)
EOS PCT: 1 % (ref 0–5)
Eosinophils Absolute: 0.1 10*3/uL (ref 0.0–0.7)
HCT: 45.8 % (ref 39.0–52.0)
HEMOGLOBIN: 15.7 g/dL (ref 13.0–17.0)
LYMPHS ABS: 2.3 10*3/uL (ref 0.7–4.0)
LYMPHS PCT: 28 % (ref 12–46)
MCH: 30.7 pg (ref 26.0–34.0)
MCHC: 34.3 g/dL (ref 30.0–36.0)
MCV: 89.5 fL (ref 78.0–100.0)
MPV: 10.2 fL (ref 8.6–12.4)
Monocytes Absolute: 0.7 10*3/uL (ref 0.1–1.0)
Monocytes Relative: 9 % (ref 3–12)
Neutro Abs: 5 10*3/uL (ref 1.7–7.7)
Neutrophils Relative %: 62 % (ref 43–77)
Platelets: 240 10*3/uL (ref 150–400)
RBC: 5.12 MIL/uL (ref 4.22–5.81)
RDW: 14.4 % (ref 11.5–15.5)
WBC: 8.1 10*3/uL (ref 4.0–10.5)

## 2014-06-11 LAB — HEPATIC FUNCTION PANEL
ALBUMIN: 4.1 g/dL (ref 3.5–5.2)
ALK PHOS: 42 U/L (ref 39–117)
ALT: 11 U/L (ref 0–53)
AST: 14 U/L (ref 0–37)
BILIRUBIN INDIRECT: 0.5 mg/dL (ref 0.2–1.2)
Bilirubin, Direct: 0.1 mg/dL (ref 0.0–0.3)
Total Bilirubin: 0.6 mg/dL (ref 0.2–1.2)
Total Protein: 6.6 g/dL (ref 6.0–8.3)

## 2014-06-11 LAB — LIPID PANEL
CHOL/HDL RATIO: 5.2 ratio
Cholesterol: 222 mg/dL — ABNORMAL HIGH (ref 0–200)
HDL: 43 mg/dL (ref >=40)
LDL CALC: 153 mg/dL — AB (ref 0–99)
TRIGLYCERIDES: 132 mg/dL (ref <150)
VLDL: 26 mg/dL (ref 0–40)

## 2014-06-11 LAB — TSH: TSH: 1.723 u[IU]/mL (ref 0.350–4.500)

## 2014-06-11 LAB — BASIC METABOLIC PANEL WITH GFR
BUN: 14 mg/dL (ref 6–23)
CO2: 25 mEq/L (ref 19–32)
Calcium: 9.2 mg/dL (ref 8.4–10.5)
Chloride: 104 mEq/L (ref 96–112)
Creat: 1.08 mg/dL (ref 0.50–1.35)
GFR, Est African American: 83 mL/min
GFR, Est Non African American: 72 mL/min
Glucose, Bld: 84 mg/dL (ref 70–99)
Potassium: 4.2 mEq/L (ref 3.5–5.3)
Sodium: 139 mEq/L (ref 135–145)

## 2014-06-11 LAB — VITAMIN B12: VITAMIN B 12: 408 pg/mL (ref 211–911)

## 2014-06-11 LAB — IRON AND TIBC
%SAT: 38 % (ref 20–55)
IRON: 131 ug/dL (ref 42–165)
TIBC: 342 ug/dL (ref 215–435)
UIBC: 211 ug/dL (ref 125–400)

## 2014-06-11 LAB — HEMOGLOBIN A1C
Hgb A1c MFr Bld: 5.7 % — ABNORMAL HIGH (ref <5.7)
Mean Plasma Glucose: 117 mg/dL — ABNORMAL HIGH (ref <117)

## 2014-06-11 LAB — MAGNESIUM: Magnesium: 2.1 mg/dL (ref 1.5–2.5)

## 2014-06-11 NOTE — Progress Notes (Signed)
Patient ID: Kenneth Cantu, male   DOB: 06/06/1950, 64 y.o.   MRN: [id number]   Annual Comprehensive Examination  This very nice 64 y.o. SWM presents for complete physical.  Patient has been followed for Labile HTN, Prediabetes, Hyperlipidemia, Testosterone and Vitamin D Deficiency.   Labile HTN predates since 1995 & patient is monitored expectantly. Patient's BP has been controlled at home.Today's BP: 130/76 mmHg. Patient denies any cardiac symptoms as chest pain, palpitations, shortness of breath, dizziness or ankle swelling.   Patient's hyperlipidemia is controlled with diet and medications. Patient denies myalgias or other medication SE's. Last lipids were elevated & not at goal - Total Chol 243, HDL 43, Trig 153 and elevated LDL 169.      Patient has prediabetes since 2012 with an A1c of 5.8% and patient denies reactive hypoglycemic symptoms, visual blurring, diabetic polys or paresthesias. Last A1c was still 5.8% on 09/14/2013.     Finally, patient has history of Vitamin D Deficiency of 43 in 2011 and last vitamin D was  66 on 09/14/2013.       Medication Sig  . VITAMIN D 2000 UNITS  Take 4,000 Units  daily.   bASA 81 mg Take 1 tablet daily  . Multiple Vitamins-Minerals  Take  daily.   No Known Allergies   Past Medical History  Diagnosis Date  . Mixed hyperlipidemia   . Labile hypertension   . Vitamin D deficiency    Health Maintenance  Topic Date Due  . TETANUS/TDAP  05/17/1969  . COLONOSCOPY  05/17/2000  . ZOSTAVAX  05/18/2010  . INFLUENZA VACCINE  08/06/2014  . HIV Screening  Completed   Immunization History  Administered Date(s) Administered  . DT 06/11/2014  . PPD Test 06/08/2013, 06/11/2014   Past Surgical History  Procedure Laterality Date  . Appendectomy  1981  . Refractive surgery Bilateral 2011  . Wrist fracture surgery Left 1970's  . Orif clavicle fracture Left 1970s   Family History  Problem Relation Age of Onset  . Thyroid disease Mother   .  Hypertension Father   . Benign prostatic hyperplasia Father   . Hypertension Brother   . Heart disease Brother   . Cancer Other     lung  . Seizures Other    History   Social History  . Marital Status: Single - in a monogamous relationship x 17 years    Spouse Name: N/A  . Number of Children: N/A  . Years of Education: N/A   Occupational History  . Not on file.   Social History Main Topics  . Smoking status: Former Smoker    Quit date: 09/15/1983  . Smokeless tobacco: Not on file  . Alcohol Use: No  . Drug Use: No  . Sexual Activity: Not on file    ROS Constitutional: Denies fever, chills, weight loss/gain, headaches, insomnia,  night sweats or change in appetite. Does c/o fatigue. Eyes: Denies redness, blurred vision, diplopia, discharge, itchy or watery eyes.  ENT: Denies discharge, congestion, post nasal drip, epistaxis, sore throat, earache, hearing loss, dental pain, Tinnitus, Vertigo, Sinus pain or snoring.  Cardio: Denies chest pain, palpitations, irregular heartbeat, syncope, dyspnea, diaphoresis, orthopnea, PND, claudication or edema Respiratory: denies cough, dyspnea, DOE, pleurisy, hoarseness, laryngitis or wheezing.  Gastrointestinal: Denies dysphagia, heartburn, reflux, water brash, pain, cramps, nausea, vomiting, bloating, diarrhea, constipation, hematemesis, melena, hematochezia, jaundice or hemorrhoids Genitourinary: Denies dysuria, frequency, urgency, nocturia, hesitancy, discharge, hematuria or flank pain Musculoskeletal: Denies arthralgia, myalgia, stiffness, Jt. Swelling, pain, limp  or strain/sprain. Denies Falls. Skin: Denies puritis, rash, hives, warts, acne, eczema or change in skin lesion Neuro: No weakness, tremor, incoordination, spasms, paresthesia or pain Psychiatric: Denies confusion, memory loss or sensory loss. Denies Depression. Endocrine: Denies change in weight, skin, hair change, nocturia, and paresthesia, diabetic polys, visual blurring or  hyper / hypo glycemic episodes.  Heme/Lymph: No excessive bleeding, bruising or enlarged lymph nodes.  Physical Exam  BP 130/76  Pulse 64  Temp 97.7 F  Resp 16  Ht 5' 8.75"   Wt 218 lb     BMI 32.44   General Appearance: Well nourished, in no apparent distress. Eyes: PERRLA, EOMs, conjunctiva no swelling or erythema, normal fundi and vessels. Sinuses: No frontal/maxillary tenderness ENT/Mouth: EACs patent / TMs  nl. Nares clear without erythema, swelling, mucoid exudates. Oral hygiene is good. No erythema, swelling, or exudate. Tongue normal, non-obstructing. Tonsils not swollen or erythematous. Hearing normal.  Neck: Supple, thyroid normal. No bruits, nodes or JVD. Respiratory: Respiratory effort normal.  BS equal and clear bilateral without rales, rhonci, wheezing or stridor. Cardio: Heart sounds are normal with regular rate and rhythm and no murmurs, rubs or gallops. Peripheral pulses are normal and equal bilaterally without edema. No aortic or femoral bruits. Chest: symmetric with normal excursions and percussion.  Abdomen: Flat, soft, with bowel sounds. Nontender, no guarding, rebound, hernias, masses, or organomegaly.  Lymphatics: Non tender without lymphadenopathy.  Genitourinary: No hernias.Testes nl. DRE - prostate nl for age - smooth & firm w/o nodules. Musculoskeletal: Full ROM all peripheral extremities, joint stability, 5/5 strength, and normal gait. Skin: Warm and dry without rashes, lesions, cyanosis, clubbing or  ecchymosis.  Neuro: Cranial nerves intact, reflexes equal bilaterally. Normal muscle tone, no cerebellar symptoms. Sensation intact.  Pysch: Awake and oriented X 3 with normal affect, insight and judgment appropriate.   Assessment and Plan  1. Labile hypertension  - Microalbumin / creatinine urine ratio - EKG 12-Lead - [place], RETROPERITNL ABD,  LTD - TSH  2. Hyperlipidemia  - Lipid panel  3. Prediabetes  - Hemoglobin A1c - Insulin, random  4.  Vitamin D deficiency  - Vit D  25 hydroxy (rtn osteoporosis monitoring)  5. Obesity   6. Testosterone deficiency  - Testosterone  7. Screening for rectal cancer  - POC Hemoccult Bld/Stl (3-Cd Home Screen); Future  8. Prostate cancer screening  - PSA  9. Other fatigue  - Vitamin B12 - Iron and TIBC - TSH  10. Medication management  - Urine Microscopic - CBC with Differential/Platelet - BASIC METABOLIC PANEL WITH GFR - Hepatic function panel - Magnesium  11. Screening examination for pulmonary tuberculosis  - PPD  12. Need for prophylactic vaccination with tetanus-diphtheria (TD)  - DT Vaccine greater than 7yo IM   Continue prudent diet as discussed, weight control, BP monitoring, regular exercise, and medications as discussed.  Discussed med effects and SE's. Routine screening labs and tests as requested with regular follow-up as recommended.  Over 40 minutes of exam, counseling &  chart review was performed

## 2014-06-11 NOTE — Patient Instructions (Signed)

## 2014-06-12 LAB — URINALYSIS, MICROSCOPIC ONLY
Bacteria, UA: NONE SEEN
CASTS: NONE SEEN
CRYSTALS: NONE SEEN
SQUAMOUS EPITHELIAL / LPF: NONE SEEN

## 2014-06-12 LAB — MICROALBUMIN / CREATININE URINE RATIO
CREATININE, URINE: 169.4 mg/dL
Microalb Creat Ratio: 2.4 mg/g (ref 0.0–30.0)
Microalb, Ur: 0.4 mg/dL (ref <2.0)

## 2014-06-12 LAB — INSULIN, RANDOM: Insulin: 5.6 u[IU]/mL (ref 2.0–19.6)

## 2014-06-12 LAB — VITAMIN D 25 HYDROXY (VIT D DEFICIENCY, FRACTURES): VIT D 25 HYDROXY: 53 ng/mL (ref 30–100)

## 2014-06-12 LAB — TESTOSTERONE: Testosterone: 427 ng/dL (ref 300–890)

## 2014-06-12 LAB — PSA: PSA: 2.08 ng/mL (ref <=4.00)

## 2014-06-19 LAB — TB SKIN TEST
Induration: 0 mm
TB SKIN TEST: NEGATIVE

## 2014-07-03 ENCOUNTER — Other Ambulatory Visit (INDEPENDENT_AMBULATORY_CARE_PROVIDER_SITE_OTHER): Payer: 59

## 2014-07-03 DIAGNOSIS — Z1212 Encounter for screening for malignant neoplasm of rectum: Secondary | ICD-10-CM

## 2014-07-03 LAB — POC HEMOCCULT BLD/STL (HOME/3-CARD/SCREEN)
FECAL OCCULT BLD: NEGATIVE
FECAL OCCULT BLD: NEGATIVE
FECAL OCCULT BLD: NEGATIVE

## 2014-10-12 ENCOUNTER — Ambulatory Visit (INDEPENDENT_AMBULATORY_CARE_PROVIDER_SITE_OTHER): Payer: Commercial Managed Care - HMO | Admitting: Internal Medicine

## 2014-10-12 ENCOUNTER — Encounter: Payer: Self-pay | Admitting: Internal Medicine

## 2014-10-12 VITALS — BP 126/68 | HR 64 | Temp 97.7°F | Resp 16 | Ht 68.75 in | Wt 224.2 lb

## 2014-10-12 DIAGNOSIS — S60211A Contusion of right wrist, initial encounter: Secondary | ICD-10-CM

## 2014-10-12 DIAGNOSIS — S060X1A Concussion with loss of consciousness of 30 minutes or less, initial encounter: Secondary | ICD-10-CM | POA: Diagnosis not present

## 2014-10-12 DIAGNOSIS — S0093XA Contusion of unspecified part of head, initial encounter: Secondary | ICD-10-CM

## 2014-10-12 DIAGNOSIS — S20212A Contusion of left front wall of thorax, initial encounter: Secondary | ICD-10-CM

## 2014-10-12 NOTE — Patient Instructions (Signed)
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
## 2015-06-27 ENCOUNTER — Encounter: Payer: Self-pay | Admitting: Internal Medicine

## 2015-06-27 ENCOUNTER — Ambulatory Visit: Payer: Medicare Other | Admitting: Internal Medicine

## 2015-06-27 VITALS — BP 130/78 | HR 68 | Temp 97.5°F | Resp 16 | Ht 68.75 in | Wt 223.2 lb

## 2015-06-27 DIAGNOSIS — Z136 Encounter for screening for cardiovascular disorders: Secondary | ICD-10-CM

## 2015-06-27 DIAGNOSIS — R5383 Other fatigue: Secondary | ICD-10-CM

## 2015-06-27 DIAGNOSIS — E349 Endocrine disorder, unspecified: Secondary | ICD-10-CM

## 2015-06-27 DIAGNOSIS — E782 Mixed hyperlipidemia: Secondary | ICD-10-CM

## 2015-06-27 DIAGNOSIS — R0989 Other specified symptoms and signs involving the circulatory and respiratory systems: Secondary | ICD-10-CM

## 2015-06-27 DIAGNOSIS — E559 Vitamin D deficiency, unspecified: Secondary | ICD-10-CM

## 2015-06-27 DIAGNOSIS — Z1212 Encounter for screening for malignant neoplasm of rectum: Secondary | ICD-10-CM

## 2015-06-27 DIAGNOSIS — R7303 Prediabetes: Secondary | ICD-10-CM

## 2015-06-27 DIAGNOSIS — Z125 Encounter for screening for malignant neoplasm of prostate: Secondary | ICD-10-CM

## 2015-06-27 DIAGNOSIS — Z0001 Encounter for general adult medical examination with abnormal findings: Secondary | ICD-10-CM

## 2015-06-27 DIAGNOSIS — Z79899 Other long term (current) drug therapy: Secondary | ICD-10-CM

## 2015-06-27 LAB — CBC WITH DIFFERENTIAL/PLATELET
Basophils Absolute: 0 cells/uL (ref 0–200)
Basophils Relative: 0 %
EOS ABS: 79 {cells}/uL (ref 15–500)
Eosinophils Relative: 1 %
HEMATOCRIT: 44.8 % (ref 38.5–50.0)
Hemoglobin: 15.2 g/dL (ref 13.2–17.1)
LYMPHS PCT: 27 %
Lymphs Abs: 2133 cells/uL (ref 850–3900)
MCH: 31.1 pg (ref 27.0–33.0)
MCHC: 33.9 g/dL (ref 32.0–36.0)
MCV: 91.6 fL (ref 80.0–100.0)
MONO ABS: 632 {cells}/uL (ref 200–950)
MONOS PCT: 8 %
MPV: 10.3 fL (ref 7.5–12.5)
NEUTROS PCT: 64 %
Neutro Abs: 5056 cells/uL (ref 1500–7800)
Platelets: 237 10*3/uL (ref 140–400)
RBC: 4.89 MIL/uL (ref 4.20–5.80)
RDW: 14.4 % (ref 11.0–15.0)
WBC: 7.9 10*3/uL (ref 3.8–10.8)

## 2015-06-27 NOTE — Patient Instructions (Signed)

## 2015-06-27 NOTE — Progress Notes (Deleted)
   Subjective:    Patient ID: Kenneth Cantu, male    DOB: [birth year], 65 y.o.   MRN: [id number]  HPI    Review of Systems     Objective:   Physical Exam        Assessment & Plan:

## 2015-06-27 NOTE — Progress Notes (Signed)
Patient ID: Kenneth SimonKelly F Cantu, male   DOB: 06/17/1950, 65 y.o.   MRN: [id number]  [facility]   Lucky CowboyWilliam Dinh Ayotte, M.D.    Dyanne CarrelAmanda R. Steffanie Dunnollier, P.A.-C      Terri Piedraourtney Forcucci, P.A.-C   [facility]                [address]                [place], [place] [id number] Telephone [phone] Telefax [phone] _________________________________  Annual  Screening/Preventative Visit And Comprehensive Evaluation & Examination     This very nice 65 y.o. [name] presents for a Wellness/Preventative Visit & comprehensive evaluation and management of multiple medical co-morbidities.  Patient has been followed for HTN, Prediabetes, Hyperlipidemia and Vitamin D Deficiency.     Patient has labile HTN predates since 1995 and is monitored expectantly. Patient's BP has been controlled at home.Today's BP is 130/78 mmHg. Patient denies any cardiac symptoms as chest pain, palpitations, shortness of breath, dizziness or ankle swelling.     Patient's hyperlipidemia is not controlled with diet and he has been resistant to taking medications for Cholesterol.  Last lipids were not at goal with elevated T Chol 222, HDL 43, Trig 132 and elevated LDL 153 in June 2016.      Patient has hx/o Morbid Obesity (BMI 33+) and consequent prediabetes with an elevated A1c 5.8% since 2012 and patient denies reactive hypoglycemic symptoms, visual blurring, diabetic polys or paresthesias. Last A1c was 5.7% in June 2016.      Finally, patient has history of Vitamin D Deficiency of "3643" on supplements in 2011  and last vitamin D was 53 in June 2016.   Medication Sig  . VITAMIN D 2000 UNITS  Take 4,000 Units by mouth daily.  . Multiple Vitamins-Minerals  Take by mouth daily.   No Known Allergies  Past Medical History  Diagnosis Date  . Mixed hyperlipidemia   . Labile hypertension   . Vitamin D deficiency    Health Maintenance  Topic Date Due  . TETANUS/TDAP   05/17/1969  . COLONOSCOPY  05/17/2000  . ZOSTAVAX  05/18/2010  . PNA vac Low Risk Adult (1 of 2 - PCV13) 05/18/2015  . INFLUENZA VACCINE  08/06/2015  . Hepatitis C Screening  Completed  . HIV Screening  Completed   Immunization History  Administered Date(s) Administered  . DT 06/11/2014  . PPD Test 06/08/2013, 06/11/2014   Family History  Problem Relation Age of Onset  . Thyroid disease Mother   . Hypertension Father   . Benign prostatic hyperplasia Father   . Hypertension Brother   . Heart disease Brother   . Cancer Other     lung  . Seizures Other    Social History  . Marital Status: Single    Spouse Name: N/A  . Number of Children: N/A  . Years of Education: N/A   Occupational History  . Retired Company secretaryfireman 2009.    Social History Main Topics  . Smoking status: Former Smoker    Quit date: 09/15/1983  . Smokeless tobacco: Not on file  . Alcohol Use: No  . Drug Use: No  . Sexual Activity: Not on file    ROS Constitutional: Denies fever, chills, weight loss/gain, headaches, insomnia,  night sweats or change in appetite. Does c/o fatigue. Eyes: Denies redness, blurred vision, diplopia, discharge, itchy or watery eyes.  ENT: Denies discharge, congestion, post nasal drip, epistaxis, sore throat, earache,  hearing loss, dental pain, Tinnitus, Vertigo, Sinus pain or snoring.  Cardio: Denies chest pain, palpitations, irregular heartbeat, syncope, dyspnea, diaphoresis, orthopnea, PND, claudication or edema Respiratory: denies cough, dyspnea, DOE, pleurisy, hoarseness, laryngitis or wheezing.  Gastrointestinal: Denies dysphagia, heartburn, reflux, water brash, pain, cramps, nausea, vomiting, bloating, diarrhea, constipation, hematemesis, melena, hematochezia, jaundice or hemorrhoids Genitourinary: Denies dysuria, frequency, urgency, nocturia, hesitancy, discharge, hematuria or flank pain Musculoskeletal: Denies arthralgia, myalgia, stiffness, Jt. Swelling, pain, limp or  strain/sprain. Denies Falls. Skin: Denies puritis, rash, hives, warts, acne, eczema or change in skin lesion Neuro: No weakness, tremor, incoordination, spasms, paresthesia or pain Psychiatric: Denies confusion, memory loss or sensory loss. Denies Depression. Endocrine: Denies change in weight, skin, hair change, nocturia, and paresthesia, diabetic polys, visual blurring or hyper / hypo glycemic episodes.  Heme/Lymph: No excessive bleeding, bruising or enlarged lymph nodes.  Physical Exam  BP 130/78   Pulse 68  Temp 97.5 F   Resp 16  Ht 5' 8.75"   Wt 223 lb 3.2 oz     BMI 33.21   General Appearance: Well nourished, in no apparent distress. Eyes: PERRLA, EOMs, conjunctiva no swelling or erythema, normal fundi and vessels. Sinuses: No frontal/maxillary tenderness ENT/Mouth: EACs patent / TMs  nl. Nares clear without erythema, swelling, mucoid exudates. Oral hygiene is good. No erythema, swelling, or exudate. Tongue normal, non-obstructing. Tonsils not swollen or erythematous. Hearing normal.  Neck: Supple, thyroid normal. No bruits, nodes or JVD. Respiratory: Respiratory effort normal.  BS equal and clear bilateral without rales, rhonci, wheezing or stridor. Cardio: Heart sounds are normal with regular rate and rhythm and no murmurs, rubs or gallops. Peripheral pulses are normal and equal bilaterally without edema. No aortic or femoral bruits. Chest: symmetric with normal excursions and percussion.  Abdomen: Soft, with Nl bowel sounds. Nontender, no guarding, rebound, hernias, masses, or organomegaly.  Lymphatics: Non tender without lymphadenopathy.  Genitourinary: No hernias.Testes nl. DRE - prostate nl for age - smooth & firm w/o nodules. Musculoskeletal: Full ROM all peripheral extremities, joint stability, 5/5 strength, and normal gait. Skin: Warm and dry without rashes, lesions, cyanosis, clubbing or  ecchymosis.  Neuro: Cranial nerves intact, reflexes equal bilaterally. Normal  muscle tone, no cerebellar symptoms. Sensation intact.  Pysch: Alert and oriented X 3 with normal affect, insight and judgment appropriate.   Assessment and Plan  1. Annual Preventative/Screening Exam   - Microalbumin / creatinine urine ratio - EKG 12-Lead - US, RETROPERITNL ABD,  LTD - POC Hemoccult Bld/Stl  - Urinalysis, Routine w reflex microscopic  - Vitamin B12 - Iron and TIBC - PSA - Testosterone - CBC with Differential/Platelet - BASIC METABOLIC PANEL WITH GFR - Hepatic function panel - Magnesium - Lipid panel - TSH - Hemoglobin A1c - Insulin, random - VITAMIN D 25 Hydroxy   2. Labile hypertension  - Microalbumin / creatinine urine ratio - EKG 12-Lead - US, RETROPERITNL ABD,  LTD - TSH  3. Hyperlipidemia  - Lipid panel - TSH  4. Prediabetes  - Hemoglobin A1c - Insulin, random  5. Vitamin D deficiency  - VITAMIN D 25 Hydroxy   6. Testosterone deficiency  - Testosterone  7. Morbid obesity due to excess calories (HCC)   8. Prostate cancer screening  - PSA  9. Other fatigue  - Vitamin B12 - Iron and TIBC - Testosterone - CBC with Differential/Platelet - TSH  10. Screening for rectal cancer  - POC Hemoccult Bld/Stl   11. Medication management  - Urinalysis, Routine w reflex  microscopic - CBC with Differential/Platelet - BASIC METABOLIC PANEL WITH GFR - Hepatic function panel - Magnesium  12. Screening for AAA (aortic abdominal aneurysm)   13. Screening for ischemic heart disease   Continue prudent diet as discussed, weight control, BP monitoring, regular exercise, and medications as discussed.  Discussed med effects and SE's. Routine screening labs and tests as requested with regular follow-up as recommended. Over 40 minutes of exam, counseling, chart review and high complex critical decision making was performed

## 2015-06-28 ENCOUNTER — Other Ambulatory Visit: Payer: Self-pay | Admitting: Internal Medicine

## 2015-06-28 DIAGNOSIS — E785 Hyperlipidemia, unspecified: Secondary | ICD-10-CM

## 2015-06-28 LAB — BASIC METABOLIC PANEL WITH GFR
BUN: 14 mg/dL (ref 7–25)
CALCIUM: 9 mg/dL (ref 8.6–10.3)
CO2: 19 mmol/L — ABNORMAL LOW (ref 20–31)
Chloride: 101 mmol/L (ref 98–110)
Creat: 1.06 mg/dL (ref 0.70–1.25)
GFR, EST AFRICAN AMERICAN: 85 mL/min (ref >=60)
GFR, Est Non African American: 73 mL/min (ref >=60)
GLUCOSE: 84 mg/dL (ref 65–99)
Potassium: 3.9 mmol/L (ref 3.5–5.3)
Sodium: 134 mmol/L — ABNORMAL LOW (ref 135–146)

## 2015-06-28 LAB — URINALYSIS, ROUTINE W REFLEX MICROSCOPIC
BILIRUBIN URINE: NEGATIVE
Glucose, UA: NEGATIVE
Hgb urine dipstick: NEGATIVE
Ketones, ur: NEGATIVE
Leukocytes, UA: NEGATIVE
Nitrite: NEGATIVE
PH: 6.5 (ref 5.0–8.0)
Protein, ur: NEGATIVE
SPECIFIC GRAVITY, URINE: 1.008 (ref 1.001–1.035)

## 2015-06-28 LAB — PSA: PSA: 2.66 ng/mL (ref <=4.00)

## 2015-06-28 LAB — LIPID PANEL
CHOLESTEROL: 209 mg/dL — AB (ref 125–200)
HDL: 45 mg/dL (ref >=40)
LDL Cholesterol: 139 mg/dL — ABNORMAL HIGH (ref <130)
TRIGLYCERIDES: 125 mg/dL (ref <150)
Total CHOL/HDL Ratio: 4.6 Ratio (ref <=5.0)
VLDL: 25 mg/dL (ref <30)

## 2015-06-28 LAB — MAGNESIUM: MAGNESIUM: 2 mg/dL (ref 1.5–2.5)

## 2015-06-28 LAB — INSULIN, RANDOM: Insulin: 4.9 u[IU]/mL (ref 2.0–19.6)

## 2015-06-28 LAB — MICROALBUMIN / CREATININE URINE RATIO: CREATININE, URINE: 61 mg/dL (ref 20–370)

## 2015-06-28 LAB — TESTOSTERONE: Testosterone: 370 ng/dL (ref 250–827)

## 2015-06-28 LAB — TSH: TSH: 1.5 mIU/L (ref 0.40–4.50)

## 2015-06-28 LAB — IRON AND TIBC
%SAT: 38 % (ref 15–60)
Iron: 122 ug/dL (ref 50–180)
TIBC: 325 ug/dL (ref 250–425)
UIBC: 203 ug/dL (ref 125–400)

## 2015-06-28 LAB — HEPATIC FUNCTION PANEL
ALBUMIN: 4 g/dL (ref 3.6–5.1)
ALT: 11 U/L (ref 9–46)
AST: 16 U/L (ref 10–35)
Alkaline Phosphatase: 44 U/L (ref 40–115)
Bilirubin, Direct: 0.1 mg/dL (ref <=0.2)
Indirect Bilirubin: 0.4 mg/dL (ref 0.2–1.2)
TOTAL PROTEIN: 6.5 g/dL (ref 6.1–8.1)
Total Bilirubin: 0.5 mg/dL (ref 0.2–1.2)

## 2015-06-28 LAB — VITAMIN B12: VITAMIN B 12: 503 pg/mL (ref 200–1100)

## 2015-06-28 LAB — VITAMIN D 25 HYDROXY (VIT D DEFICIENCY, FRACTURES): Vit D, 25-Hydroxy: 68 ng/mL (ref 30–100)

## 2015-06-28 LAB — HEMOGLOBIN A1C
HEMOGLOBIN A1C: 5.7 % — AB (ref <5.7)
MEAN PLASMA GLUCOSE: 117 mg/dL

## 2015-06-28 MED ORDER — ATORVASTATIN CALCIUM 80 MG PO TABS: ORAL_TABLET | ORAL | Status: DC

## 2015-07-11 ENCOUNTER — Other Ambulatory Visit: Payer: Self-pay | Admitting: *Deleted

## 2015-07-11 DIAGNOSIS — Z0001 Encounter for general adult medical examination with abnormal findings: Secondary | ICD-10-CM

## 2015-07-11 DIAGNOSIS — Z1212 Encounter for screening for malignant neoplasm of rectum: Secondary | ICD-10-CM

## 2015-07-11 LAB — POC HEMOCCULT BLD/STL (HOME/3-CARD/SCREEN)
FECAL OCCULT BLD: NEGATIVE
FECAL OCCULT BLD: NEGATIVE
FECAL OCCULT BLD: NEGATIVE

## 2015-08-27 ENCOUNTER — Encounter: Payer: Self-pay | Admitting: Internal Medicine

## 2015-08-27 ENCOUNTER — Ambulatory Visit (INDEPENDENT_AMBULATORY_CARE_PROVIDER_SITE_OTHER): Payer: Medicare Other | Admitting: Internal Medicine

## 2015-08-27 VITALS — BP 126/64 | HR 66 | Temp 97.8°F | Resp 16 | Ht 68.75 in | Wt 215.0 lb

## 2015-08-27 DIAGNOSIS — Z0000 Encounter for general adult medical examination without abnormal findings: Secondary | ICD-10-CM

## 2015-08-27 DIAGNOSIS — E349 Endocrine disorder, unspecified: Secondary | ICD-10-CM

## 2015-08-27 DIAGNOSIS — M25562 Pain in left knee: Secondary | ICD-10-CM

## 2015-08-27 DIAGNOSIS — Z79899 Other long term (current) drug therapy: Secondary | ICD-10-CM

## 2015-08-27 DIAGNOSIS — R7303 Prediabetes: Secondary | ICD-10-CM | POA: Diagnosis not present

## 2015-08-27 DIAGNOSIS — E559 Vitamin D deficiency, unspecified: Secondary | ICD-10-CM | POA: Diagnosis not present

## 2015-08-27 DIAGNOSIS — I10 Essential (primary) hypertension: Secondary | ICD-10-CM

## 2015-08-27 DIAGNOSIS — E782 Mixed hyperlipidemia: Secondary | ICD-10-CM | POA: Diagnosis not present

## 2015-08-27 DIAGNOSIS — R6889 Other general symptoms and signs: Secondary | ICD-10-CM

## 2015-08-27 DIAGNOSIS — Z0001 Encounter for general adult medical examination with abnormal findings: Secondary | ICD-10-CM | POA: Diagnosis not present

## 2015-08-27 DIAGNOSIS — R0989 Other specified symptoms and signs involving the circulatory and respiratory systems: Secondary | ICD-10-CM

## 2015-08-27 DIAGNOSIS — E291 Testicular hypofunction: Secondary | ICD-10-CM | POA: Diagnosis not present

## 2015-08-27 NOTE — Progress Notes (Signed)
WELCOME TO MEDICARE ANNUAL WELLNESS VISIT AND FOLLOW UP Assessment:    1. Labile hypertension -cont dash diet -monitor at home -exercise as tolerated -not currently on meds  2. Hyperlipidemia -refuses further medications -diet and exercise as tolerated  3. Vitamin D deficiency -cont Vit D   4. Prediabetes -cont diet and exercise -biyearly labs  5. Medication management -cont biyearly labs  6. Morbid obesity due to excess calories (HCC) -diet and exercise  7. Testosterone deficiency -not currently on therapy  8. Left knee pain  - Ambulatory referral to Orthopedic Surgery    Over 30 minutes of exam, counseling, chart review, and critical decision making was performed  Future Appointments Date Time Provider Department Center  07/23/2016 2:00 PM Lucky Cowboy, MD [facility] None     Plan:   During the course of the visit the patient was educated and counseled about appropriate screening and preventive services including:    Pneumococcal vaccine   Influenza vaccine  Prevnar 13  Td vaccine  Screening electrocardiogram  Colorectal cancer screening  Diabetes screening  Glaucoma screening  Nutrition counseling    Subjective:  Kenneth Cantu is a 65 y.o. male who presents for Medicare Annual Wellness Visit and 3 month follow up for HTN, hyperlipidemia, prediabetes, and vitamin D Def.   His blood pressure has been controlled at home, today their BP is BP: 126/64 He does not workout. He denies chest pain, shortness of breath, dizziness.    He is not on cholesterol medication and denies myalgias. His cholesterol is at goal. The cholesterol last visit was:   Lab Results  Component Value Date   CHOL 209 (H) 06/27/2015   HDL 45 06/27/2015   LDLCALC 139 (H) 06/27/2015   TRIG 125 06/27/2015   CHOLHDL 4.6 06/27/2015  He refuses further medication for this.    He has diet controlled prediabetes.  He is currently working on both diet and exercise.    Lab Results  Component Value Date   HGBA1C 5.7 (H) 06/27/2015   Last GFR Lab Results  Component Value Date   GFRNONAA 73 06/27/2015     Lab Results  Component Value Date   GFRAA 85 06/27/2015   Patient is on Vitamin D supplement.   Lab Results  Component Value Date   VD25OH 68 06/27/2015      Patient has a history of left knee pain.  He has seen Delbert Harness in the past and was told he had a cartilage problem in his knee.  He has pain with it and is afriad to mistep in case his knee buckles.    Medication Review: Current Outpatient Prescriptions on File Prior to Visit  Medication Sig Dispense Refill  . Cholecalciferol (VITAMIN D) 2000 UNITS tablet Take 4,000 Units by mouth daily.    . Multiple Vitamins-Minerals (MULTIVITAMIN & MINERAL PO) Take by mouth daily.     No current facility-administered medications on file prior to visit.     Allergies: No Known Allergies  Current Problems (verified) has Hyperlipidemia; Labile hypertension; Vitamin D deficiency; Medication management; Morbid obesity (BMI 32.44) ; Prediabetes; and Testosterone deficiency on his problem list.  Screening Tests Immunization History  Administered Date(s) Administered  . DT 06/11/2014  . PPD Test 06/08/2013, 06/11/2014    Preventative care: Last colonoscopy: Due, ordered cologuard.   Prior vaccinations: TD or Tdap: 2016  Influenza: Declined  Pneumococcal: Declined Prevnar13: Declined  Shingles/Zostavax:   Names of Other Physician/Practitioners you currently use: 1. [facility] here for primary care 2. [facility], Dr. West CarboEscharini, eye doctor, last visit  3. [facility] seeing, Dr. Providence LaniusHowell, dentist, last visit  Patient Care Team: Lucky CowboyWilliam McKeown, MD as PCP - General (Internal Medicine) Louis Meckelobert D Kaplan, MD as Consulting Physician (Gastroenterology)  Surgical: He  has a past surgical history that includes Appendectomy (1981); Refractive surgery (Bilateral,  2011); Wrist fracture surgery (Left, 1970's); and ORIF clavicle fracture (Left, 1970s). Family His family history includes Benign prostatic hyperplasia in his father; Cancer in his other; Heart disease in his brother; Hypertension in his brother and father; Seizures in his other; Thyroid disease in his mother. Social history  He reports that he quit smoking about 31 years ago. He does not have any smokeless tobacco history on file. He reports that he does not drink alcohol or use drugs.  MEDICARE WELLNESS OBJECTIVES: Physical activity: Current Exercise Habits: Home exercise routine, Type of exercise: walking, Time (Minutes): 60, Frequency (Times/Week): 7, Weekly Exercise (Minutes/Week): 420, Intensity: Moderate Cardiac risk factors: Cardiac Risk Factors include: dyslipidemia;advanced age (>4055men, 37>65 women) Depression/mood screen:   Dep[facility] screen [facility] 08/27/2015  Decreased Interest 1  Down, Depressed, Hopeless 1  PHQ - 2 Score 2  Altered sleeping 0  Tired, decreased energy 1  Change in appetite 0  Feeling bad or failure about yourself  0  Trouble concentrating 0  Moving slowly or fidgety/restless 0  Suicidal thoughts 0  PHQ-9 Score 3  Difficult doing work/chores Not difficult at all    ADLs:  In your present state of health, do you have any difficulty performing the following activities: 08/27/2015 06/27/2015  Hearing? N N  Vision? N N  Difficulty concentrating or making decisions? N N  Walking or climbing stairs? Y N  Dressing or bathing? N N  Doing errands, shopping? N N  Preparing Food and eating ? N -  Using the Toilet? N -  In the past six months, have you accidently leaked urine? N -  Do you have problems with loss of bowel control? N -  Managing your Medications? N -  Managing your Finances? N -  Housekeeping or managing your Housekeeping? N -  Some recent data might be hidden     Cognitive Testing  Alert? Yes  Normal Appearance?Yes  Oriented to person? Yes   Place? Yes   Time? Yes  Recall of three objects?  Yes  Can perform simple calculations? Yes  Displays appropriate judgment?Yes  Can read the correct time from a watch face?Yes  EOL planning: Does patient have an advance directive?: Yes Copy of advanced directive(s) in chart?: Yes   Objective:   Today's Vitals   08/27/15 1029  BP: 126/64  Pulse: 66  Resp: 16  Temp: 97.8 F (36.6 C)  TempSrc: Temporal  Weight: 215 lb (97.5 kg)  Height: 5' 8.75" (1.746 m)   Body mass index is 31.98 kg/m.  General appearance: alert, no distress, WD/WN, male HEENT: normocephalic, sclerae anicteric, TMs pearly, nares patent, no discharge or erythema, pharynx normal Oral cavity: MMM, no lesions Neck: supple, no lymphadenopathy, no thyromegaly, no masses Heart: RRR, normal S1, S2, no murmurs Lungs: CTA bilaterally, no wheezes, rhonchi, or rales Abdomen: +bs, soft, non tender, non distended, no masses, no hepatomegaly, no splenomegaly Musculoskeletal: nontender, no swelling, no obvious deformity Extremities: no edema, no cyanosis, no clubbing Pulses: 2+ symmetric, upper and lower extremities, normal cap refill Neurological: alert, oriented x 3, CN2-12 intact, strength normal upper extremities and lower extremities, sensation normal  throughout, DTRs 2+ throughout, no cerebellar signs, gait normal Psychiatric: normal affect, behavior normal, pleasant   Medicare Attestation I have personally reviewed: The patient's medical and social history Their use of alcohol, tobacco or illicit drugs Their current medications and supplements The patient's functional ability including ADLs,fall risks, home safety risks, cognitive, and hearing and visual impairment Diet and physical activities Evidence for depression or mood disorders  The patient's weight, height, BMI, and visual acuity have been recorded in the chart.  I have made referrals, counseling, and provided education to the patient based on review of  the above and I have provided the patient with a written personalized care plan for preventive services.     Terri Piedraourtney Forcucci, PA-C   08/27/2015

## 2015-09-27 LAB — COLOGUARD

## 2015-09-30 ENCOUNTER — Telehealth: Payer: Self-pay | Admitting: Internal Medicine

## 2015-09-30 NOTE — Telephone Encounter (Signed)
Called patient about negative cologuard results.  He is aware.  Repeat due in 3 years.

## 2015-11-10 ENCOUNTER — Encounter: Payer: Self-pay | Admitting: *Deleted

## 2016-06-26 ENCOUNTER — Ambulatory Visit (INDEPENDENT_AMBULATORY_CARE_PROVIDER_SITE_OTHER): Payer: Medicare Other | Admitting: Internal Medicine

## 2016-06-26 VITALS — BP 116/68 | HR 64 | Temp 97.5°F | Resp 16 | Ht 68.75 in | Wt 214.6 lb

## 2016-06-26 DIAGNOSIS — W57XXXA Bitten or stung by nonvenomous insect and other nonvenomous arthropods, initial encounter: Secondary | ICD-10-CM | POA: Diagnosis not present

## 2016-06-26 DIAGNOSIS — R21 Rash and other nonspecific skin eruption: Secondary | ICD-10-CM | POA: Diagnosis not present

## 2016-06-26 MED ORDER — DOXYCYCLINE HYCLATE 100 MG PO CAPS: ORAL_CAPSULE | ORAL | 0 refills | Status: DC

## 2016-06-27 ENCOUNTER — Encounter: Payer: Self-pay | Admitting: Internal Medicine

## 2016-06-27 NOTE — Progress Notes (Signed)
    Subjective:    Patient ID: Kenneth Cantu, male    DOB: Nov 21, 1950, 66 y.o.   MRN: [id number]  HPI  Patient presents with c/o Circular rash over the Le ankle. 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 He denies fever , chills and systems review is otherwise comp[letely negative.   Medication Sig  . VITAMIN D 2000 UNITS tablet Take 4,000 Units by mouth daily.  . Multiple Vitamins-Minerals  Take by mouth daily.   No Known Allergies Past Medical History:  Diagnosis Date  . Labile hypertension   . Mixed hyperlipidemia   . Vitamin D deficiency    Past Surgical History:  Procedure Laterality Date  . APPENDECTOMY  1981  . ORIF CLAVICLE FRACTURE Left 1970s  . REFRACTIVE SURGERY Bilateral 2011  . WRIST FRACTURE SURGERY Left 1970's   Review of Systems  10 point systems review negative except as above.    Objective:   Physical Exam  BP 116/68   Pulse 64   Temp 97.5 F (36.4 C)   Resp 16   Ht 5' 8.75" (1.746 m)   Wt 214 lb 9.6 oz (97.3 kg)   BMI 31.92 kg/m   HEENT - WNL. Neck - supple.  Chest - Clear equal BS. Cor - Nl HS. RRR w/o sig MGR. PP 1(+). No edema. MS- FROM w/o deformities.  Gait Nl. Neuro -  Nl w/o focal abnormalities. Skin -  On the medial Lt ankle there is a  2" circular pink flat rash with central clearing with the outer rim  ~3/8" and no evidence of eschar or insect bite. No vessicles or ulceration noted.    Assessment & Plan:   1. Tick bite, suspected  - doxycycline  100 MG capsule; Take 1 capsule 2 x / day with food for infection  Dispense: 30 capsule; Refill: 0  - Lyme Aby, Wstrn. Blt. IgG & IgM w/bands  2. Rash and nonspecific skin eruption

## 2016-06-27 NOTE — Patient Instructions (Signed)

## 2016-07-01 LAB — LYME ABY, WSTRN BLT IGG & IGM W/BANDS
B BURGDORFERI IGM ABS (IB): NEGATIVE
B burgdorferi IgG Abs (IB): NEGATIVE
LYME DISEASE 18 KD IGG: NONREACTIVE
LYME DISEASE 30 KD IGG: NONREACTIVE
LYME DISEASE 41 KD IGM: NONREACTIVE
LYME DISEASE 58 KD IGG: NONREACTIVE
Lyme Disease 23 kD IgG: NONREACTIVE
Lyme Disease 23 kD IgM: NONREACTIVE
Lyme Disease 28 kD IgG: NONREACTIVE
Lyme Disease 39 kD IgG: NONREACTIVE
Lyme Disease 39 kD IgM: NONREACTIVE
Lyme Disease 41 kD IgG: NONREACTIVE
Lyme Disease 45 kD IgG: NONREACTIVE
Lyme Disease 66 kD IgG: NONREACTIVE
Lyme Disease 93 kD IgG: NONREACTIVE

## 2016-07-23 ENCOUNTER — Ambulatory Visit (INDEPENDENT_AMBULATORY_CARE_PROVIDER_SITE_OTHER): Payer: Medicare Other | Admitting: Internal Medicine

## 2016-07-23 ENCOUNTER — Encounter: Payer: Self-pay | Admitting: Internal Medicine

## 2016-07-23 VITALS — BP 118/68 | HR 62 | Temp 97.7°F | Resp 16 | Ht 68.5 in | Wt 211.6 lb

## 2016-07-23 DIAGNOSIS — Z136 Encounter for screening for cardiovascular disorders: Secondary | ICD-10-CM

## 2016-07-23 DIAGNOSIS — I10 Essential (primary) hypertension: Secondary | ICD-10-CM

## 2016-07-23 DIAGNOSIS — Z1212 Encounter for screening for malignant neoplasm of rectum: Secondary | ICD-10-CM

## 2016-07-23 DIAGNOSIS — E782 Mixed hyperlipidemia: Secondary | ICD-10-CM

## 2016-07-23 DIAGNOSIS — Z0000 Encounter for general adult medical examination without abnormal findings: Secondary | ICD-10-CM | POA: Diagnosis not present

## 2016-07-23 DIAGNOSIS — Z79899 Other long term (current) drug therapy: Secondary | ICD-10-CM

## 2016-07-23 DIAGNOSIS — E559 Vitamin D deficiency, unspecified: Secondary | ICD-10-CM

## 2016-07-23 DIAGNOSIS — R7303 Prediabetes: Secondary | ICD-10-CM

## 2016-07-23 DIAGNOSIS — Z0001 Encounter for general adult medical examination with abnormal findings: Secondary | ICD-10-CM

## 2016-07-23 DIAGNOSIS — R5383 Other fatigue: Secondary | ICD-10-CM

## 2016-07-23 DIAGNOSIS — R0989 Other specified symptoms and signs involving the circulatory and respiratory systems: Secondary | ICD-10-CM

## 2016-07-23 DIAGNOSIS — Z125 Encounter for screening for malignant neoplasm of prostate: Secondary | ICD-10-CM

## 2016-07-23 LAB — CBC WITH DIFFERENTIAL/PLATELET
BASOS PCT: 0 %
Basophils Absolute: 0 cells/uL (ref 0–200)
EOS PCT: 2 %
Eosinophils Absolute: 140 cells/uL (ref 15–500)
HCT: 44.5 % (ref 38.5–50.0)
Hemoglobin: 14.9 g/dL (ref 13.2–17.1)
Lymphocytes Relative: 28 %
Lymphs Abs: 1960 cells/uL (ref 850–3900)
MCH: 31 pg (ref 27.0–33.0)
MCHC: 33.5 g/dL (ref 32.0–36.0)
MCV: 92.7 fL (ref 80.0–100.0)
MONOS PCT: 8 %
MPV: 10.4 fL (ref 7.5–12.5)
Monocytes Absolute: 560 cells/uL (ref 200–950)
NEUTROS ABS: 4340 {cells}/uL (ref 1500–7800)
Neutrophils Relative %: 62 %
PLATELETS: 224 10*3/uL (ref 140–400)
RBC: 4.8 MIL/uL (ref 4.20–5.80)
RDW: 14.2 % (ref 11.0–15.0)
WBC: 7 10*3/uL (ref 3.8–10.8)

## 2016-07-23 LAB — TSH: TSH: 1.1 m[IU]/L (ref 0.40–4.50)

## 2016-07-23 NOTE — Patient Instructions (Signed)

## 2016-07-23 NOTE — Progress Notes (Signed)
[facility]   Kenneth Cantu, M.D.      Kenneth CarrelAmanda R. Kenneth Cantu, P.A.-C [facility]                [address]                [place], [place] [id number] Telephone [phone] Telefax [phone] Annual  Screening/Preventative Visit  & Comprehensive Evaluation & Examination     This very nice 66 y.o. [name] presents for a Screening/Preventative Visit & comprehensive evaluation and management of multiple medical co-morbidities.  Patient has been followed for HTN, Prediabetes, Hyperlipidemia and Vitamin D Deficiency.     Labile HTN predates since 1995 and is expectantly monitored. Patient's BP has been controlled at home.  Today's BP is at goal -118/68. Patient denies any cardiac symptoms as chest pain, palpitations, shortness of breath, dizziness or ankle swelling.     Patient's hyperlipidemia is not controlled with diet and patient is reticent to taking statins or any meds for Cholesterol.  Patient does endorse a 13# weight loss over the last month.Patient denies myalgias or other medication SE's in the past. Last lipids were not at goal: Lab Results  Component Value Date   CHOL 209 (H) 06/27/2015   HDL 45 06/27/2015   LDLCALC 139 (H) 06/27/2015   TRIG 125 06/27/2015   CHOLHDL 4.6 06/27/2015      Patient has Morbid Obesity (BMI 33+) and prediabetes (A1c 5.8% in 2012) and patient denies reactive hypoglycemic symptoms, visual blurring, diabetic polys or paresthesias. Last A1c was not at goal: Lab Results  Component Value Date   HGBA1C 5.7 (H) 06/27/2015       Finally, patient has history of Vitamin D Deficiency ("43" on supplements in 2011) and last vitamin D was at goal: Lab Results  Component Value Date   VD25OH 68 06/27/2015   Current Outpatient Prescriptions on File Prior to Visit  Medication Sig  . VITAMIN D 2000 UNITS Take 4,000 Units by mouth daily.  . Multi-Vit w/Min Take by mouth daily.   No Known Allergies    Past Medical History:  Diagnosis Date  . Labile hypertension   . Mixed hyperlipidemia   . Vitamin D deficiency    Health Maintenance  Topic Date Due  . PNA vac Low Risk Adult (1 of 2 - PCV13) 07/05/2017 (Originally 05/18/2015)  . INFLUENZA VACCINE  08/05/2016  . Fecal DNA (Cologuard)  09/23/2018  . TETANUS/TDAP  06/10/2024  . Hepatitis C Screening  Completed   Immunization History  Administered Date(s) Administered  . DT 06/11/2014  . PPD Test 06/08/2013, 06/11/2014   Past Surgical History:  Procedure Laterality Date  . APPENDECTOMY  1981  . ORIF CLAVICLE FRACTURE Left 1970s  . REFRACTIVE SURGERY Bilateral 2011  . WRIST FRACTURE SURGERY Left 1970's   Family History  Problem Relation Age of Onset  . Thyroid disease Mother   . Hypertension Father   . Benign prostatic hyperplasia Father   . Hypertension Brother   . Heart disease Brother   . Cancer Other        lung  . Seizures Other    Social History   Social History  . Marital status: Single    Spouse name: N/A  . Number of children: N/A  . Years of education: N/A   Occupational History  . Not on file.   Social History Main Topics  . Smoking status: Former Smoker    Quit date: 09/15/1983  .  Smokeless tobacco: Never Used  . Alcohol use No  . Drug use: No  . Sexual activity: Not on file    ROS Constitutional: Denies fever, chills, weight loss/gain, headaches, insomnia,  night sweats or change in appetite. Does c/o fatigue. Eyes: Denies redness, blurred vision, diplopia, discharge, itchy or watery eyes.  ENT: Denies discharge, congestion, post nasal drip, epistaxis, sore throat, earache, hearing loss, dental pain, Tinnitus, Vertigo, Sinus pain or snoring.  Cardio: Denies chest pain, palpitations, irregular heartbeat, syncope, dyspnea, diaphoresis, orthopnea, PND, claudication or edema Respiratory: denies cough, dyspnea, DOE, pleurisy, hoarseness, laryngitis or wheezing.  Gastrointestinal: Denies dysphagia,  heartburn, reflux, water brash, pain, cramps, nausea, vomiting, bloating, diarrhea, constipation, hematemesis, melena, hematochezia, jaundice or hemorrhoids Genitourinary: Denies dysuria, frequency, urgency, nocturia, hesitancy, discharge, hematuria or flank pain Musculoskeletal: Denies arthralgia, myalgia, stiffness, Jt. Swelling, pain, limp or strain/sprain. Denies Falls. Skin: Denies puritis, rash, hives, warts, acne, eczema or change in skin lesion Neuro: No weakness, tremor, incoordination, spasms, paresthesia or pain Psychiatric: Denies confusion, memory loss or sensory loss. Denies Depression. Endocrine: Denies change in weight, skin, hair change, nocturia, and paresthesia, diabetic polys, visual blurring or hyper / hypo glycemic episodes.  Heme/Lymph: No excessive bleeding, bruising or enlarged lymph nodes.  Physical Exam  BP 118/68   Pulse 62   Temp 97.7 F (36.5 C)   Resp 16   Ht 5' 8.5" (1.74 m)   Wt 211 lb 9.6 oz (96 kg)   BMI 31.71 kg/m   General Appearance: Well nourished and well groomed and in no apparent distress.  Eyes: PERRLA, EOMs, conjunctiva no swelling or erythema, normal fundi and vessels. Sinuses: No frontal/maxillary tenderness ENT/Mouth: EACs patent / TMs  nl. Nares clear without erythema, swelling, mucoid exudates. Oral hygiene is good. No erythema, swelling, or exudate. Tongue normal, non-obstructing. Tonsils not swollen or erythematous. Hearing normal.  Neck: Supple, thyroid normal. No bruits, nodes or JVD. Respiratory: Respiratory effort normal.  BS equal and clear bilateral without rales, rhonci, wheezing or stridor. Cardio: Heart sounds are normal with regular rate and rhythm and no murmurs, rubs or gallops. Peripheral pulses are normal and equal bilaterally without edema. No aortic or femoral bruits. Chest: symmetric with normal excursions and percussion.  Abdomen: Soft, with Nl bowel sounds. Nontender, no guarding, rebound, hernias, masses, or  organomegaly.  Lymphatics: Non tender without lymphadenopathy.  Genitourinary: No hernias.Testes nl. DRE - prostate nl for age - smooth & firm w/o nodules. Musculoskeletal: Full ROM all peripheral extremities, joint stability, 5/5 strength, and normal gait. Skin: Warm and dry without rashes, lesions, cyanosis, clubbing or  ecchymosis.  Neuro: Cranial nerves intact, reflexes equal bilaterally. Normal muscle tone, no cerebellar symptoms. Sensation intact.  Pysch: Alert and oriented X 3 with normal affect, insight and judgment appropriate.   Assessment and Plan  1. Annual Preventative/Screening Exam   2. Labile hypertension  - EKG 12-Lead - [place], RETROPERITNL ABD,  LTD - Urinalysis, Routine w reflex microscopic - Microalbumin / creatinine urine ratio - CBC with Differential/Platelet - BASIC METABOLIC PANEL WITH GFR - Magnesium - TSH  3. Hyperlipidemia, mixed  - EKG 12-Lead - [place], RETROPERITNL ABD,  LTD - Hepatic function panel - Lipid panel - TSH  4. Prediabetes  - EKG 12-Lead - [place], RETROPERITNL ABD,  LTD - Hemoglobin A1c - Insulin, random  5. Vitamin D deficiency  - VITAMIN D 25 Hydroxy  6. Screening for rectal cancer  - POC Hemoccult Bld/Stl   7. Prostate cancer screening  - PSA  8. Screening for ischemic heart disease  - EKG 12-Lead  9. Screening for AAA (aortic abdominal aneurysm)  - [place], RETROPERITNL ABD,  LTD  10. Fatigue, unspecified type   11. Medication management  - Urinalysis, Routine w reflex microscopic - Microalbumin / creatinine urine ratio - CBC with Differential/Platelet - BASIC METABOLIC PANEL WITH GFR - Hepatic function panel - Magnesium - Lipid panel - TSH - Hemoglobin A1c - Insulin, random - VITAMIN D 25 Hydroxy        Patient was counseled in prudent diet, weight control to achieve/maintain BMI less than 25, BP monitoring, regular exercise and medications as discussed.  Discussed med effects and SE's. Routine screening labs and  tests as requested with regular follow-up as recommended. Over 40 minutes of exam, counseling, chart review and high complex critical decision making was performed

## 2016-07-24 LAB — MAGNESIUM: MAGNESIUM: 2 mg/dL (ref 1.5–2.5)

## 2016-07-24 LAB — URINALYSIS, ROUTINE W REFLEX MICROSCOPIC
BILIRUBIN URINE: NEGATIVE
GLUCOSE, UA: NEGATIVE
Hgb urine dipstick: NEGATIVE
LEUKOCYTES UA: NEGATIVE
Nitrite: NEGATIVE
PH: 5.5 (ref 5.0–8.0)
Protein, ur: NEGATIVE
Specific Gravity, Urine: 1.016 (ref 1.001–1.035)

## 2016-07-24 LAB — LIPID PANEL
CHOL/HDL RATIO: 4.9 ratio (ref <5.0)
Cholesterol: 229 mg/dL — ABNORMAL HIGH (ref <200)
HDL: 47 mg/dL (ref >40)
LDL CALC: 166 mg/dL — AB (ref <100)
TRIGLYCERIDES: 81 mg/dL (ref <150)
VLDL: 16 mg/dL (ref <30)

## 2016-07-24 LAB — BASIC METABOLIC PANEL WITH GFR
BUN: 12 mg/dL (ref 7–25)
CHLORIDE: 103 mmol/L (ref 98–110)
CO2: 20 mmol/L (ref 20–31)
CREATININE: 1.16 mg/dL (ref 0.70–1.25)
Calcium: 9.3 mg/dL (ref 8.6–10.3)
GFR, EST NON AFRICAN AMERICAN: 65 mL/min (ref >=60)
GFR, Est African American: 75 mL/min (ref >=60)
Glucose, Bld: 78 mg/dL (ref 65–99)
POTASSIUM: 4.2 mmol/L (ref 3.5–5.3)
SODIUM: 140 mmol/L (ref 135–146)

## 2016-07-24 LAB — HEPATIC FUNCTION PANEL
ALT: 10 U/L (ref 9–46)
AST: 15 U/L (ref 10–35)
Albumin: 3.9 g/dL (ref 3.6–5.1)
Alkaline Phosphatase: 37 U/L — ABNORMAL LOW (ref 40–115)
BILIRUBIN DIRECT: 0.1 mg/dL (ref <=0.2)
BILIRUBIN INDIRECT: 0.6 mg/dL (ref 0.2–1.2)
BILIRUBIN TOTAL: 0.7 mg/dL (ref 0.2–1.2)
Total Protein: 6.7 g/dL (ref 6.1–8.1)

## 2016-07-24 LAB — MICROALBUMIN / CREATININE URINE RATIO
Creatinine, Urine: 168 mg/dL (ref 20–370)
MICROALB/CREAT RATIO: 2 ug/mg{creat} (ref <30)
Microalb, Ur: 0.3 mg/dL

## 2016-07-24 LAB — HEMOGLOBIN A1C
Hgb A1c MFr Bld: 5.3 % (ref <5.7)
Mean Plasma Glucose: 105 mg/dL

## 2016-07-24 LAB — PSA: PSA: 2.4 ng/mL (ref <=4.0)

## 2016-07-24 LAB — INSULIN, RANDOM: Insulin: 3.2 u[IU]/mL (ref 2.0–19.6)

## 2016-07-24 LAB — VITAMIN D 25 HYDROXY (VIT D DEFICIENCY, FRACTURES): Vit D, 25-Hydroxy: 78 ng/mL (ref 30–100)

## 2016-08-04 ENCOUNTER — Other Ambulatory Visit: Payer: Self-pay | Admitting: *Deleted

## 2016-08-04 DIAGNOSIS — Z1212 Encounter for screening for malignant neoplasm of rectum: Secondary | ICD-10-CM

## 2016-08-04 LAB — POC HEMOCCULT BLD/STL (HOME/3-CARD/SCREEN)
FECAL OCCULT BLD: NEGATIVE
FECAL OCCULT BLD: NEGATIVE
FECAL OCCULT BLD: NEGATIVE

## 2017-08-17 ENCOUNTER — Encounter: Payer: Self-pay | Admitting: Internal Medicine

## 2017-08-17 ENCOUNTER — Ambulatory Visit: Payer: Medicare Other | Admitting: Internal Medicine

## 2017-08-17 VITALS — BP 124/70 | HR 72 | Temp 97.3°F | Resp 18 | Ht 68.75 in | Wt 211.2 lb

## 2017-08-17 DIAGNOSIS — Z79899 Other long term (current) drug therapy: Secondary | ICD-10-CM

## 2017-08-17 DIAGNOSIS — Z125 Encounter for screening for malignant neoplasm of prostate: Secondary | ICD-10-CM

## 2017-08-17 DIAGNOSIS — Z1212 Encounter for screening for malignant neoplasm of rectum: Secondary | ICD-10-CM

## 2017-08-17 DIAGNOSIS — R0989 Other specified symptoms and signs involving the circulatory and respiratory systems: Secondary | ICD-10-CM | POA: Diagnosis not present

## 2017-08-17 DIAGNOSIS — R7309 Other abnormal glucose: Secondary | ICD-10-CM

## 2017-08-17 DIAGNOSIS — Z87891 Personal history of nicotine dependence: Secondary | ICD-10-CM

## 2017-08-17 DIAGNOSIS — N138 Other obstructive and reflux uropathy: Secondary | ICD-10-CM

## 2017-08-17 DIAGNOSIS — N401 Enlarged prostate with lower urinary tract symptoms: Secondary | ICD-10-CM

## 2017-08-17 DIAGNOSIS — E559 Vitamin D deficiency, unspecified: Secondary | ICD-10-CM

## 2017-08-17 DIAGNOSIS — Z1211 Encounter for screening for malignant neoplasm of colon: Secondary | ICD-10-CM

## 2017-08-17 DIAGNOSIS — Z0000 Encounter for general adult medical examination without abnormal findings: Secondary | ICD-10-CM

## 2017-08-17 DIAGNOSIS — Z136 Encounter for screening for cardiovascular disorders: Secondary | ICD-10-CM

## 2017-08-17 DIAGNOSIS — E782 Mixed hyperlipidemia: Secondary | ICD-10-CM

## 2017-08-17 DIAGNOSIS — Z8249 Family history of ischemic heart disease and other diseases of the circulatory system: Secondary | ICD-10-CM

## 2017-08-17 DIAGNOSIS — Z0001 Encounter for general adult medical examination with abnormal findings: Secondary | ICD-10-CM

## 2017-08-17 NOTE — Patient Instructions (Addendum)

## 2017-08-17 NOTE — Progress Notes (Signed)
[facility]   Kenneth Cantu, M.D.     Kenneth CarrelAmanda R. Kenneth Cantu, P.A.-C Kenneth GaudierAshley Corbett, DNP [facility]                [address]                [place], [place] [id number] Telephone [phone] Telefax [phone]) [phone] Annual  Screening/Preventative Visit  & Comprehensive Evaluation & Examination     This very nice 67 y.o. [name] presents for a Screening /Preventative Visit & comprehensive evaluation and management of multiple medical co-morbidities.  Patient has been followed for HTN, HLD, T2_NIDDM  Prediabetes and Vitamin D Deficiency.     Patient has been followed since 1995 for labile HTN.  Patient's BP has been controlled at home.  Today's BP is at goal -  124/70. Patient denies any cardiac symptoms as chest pain, palpitations, shortness of breath, dizziness or ankle swelling.     Patient is reticent to taking statins or any meds for Cholesterol and his hyperlipidemia is not controlled with diet.Last lipids were not at goal: Lab Results  Component Value Date   CHOL 229 (H) 07/23/2016   HDL 47 07/23/2016   LDLCALC 166 (H) 07/23/2016   TRIG 81 07/23/2016   CHOLHDL 4.9 07/23/2016      Patient has hx/o Obesity (BMI 31+) and prediabetes (A1c 5.8% in 2012)  and patient denies reactive hypoglycemic symptoms, visual blurring, diabetic polys or paresthesias. Last A1c was normal & at goal: Lab Results  Component Value Date   HGBA1C 5.3 07/23/2016       Finally, patient has history of Vitamin D Deficiency ("43"/on.supplements.2011) and last vitamin D was Normal & at goal: Lab Results  Component Value Date   VD25OH 78 07/23/2016   Current Outpatient Medications on File Prior to Visit  Medication Sig  . VITAMIN D 1000 units  Take 1,000 Units  2  times daily.  . Multiple Vitamins-Minerals  Take by mouth daily.   No Known Allergies   Past Medical History:  Diagnosis Date  . Labile hypertension   . Mixed hyperlipidemia   .  Vitamin D deficiency    Health Maintenance  Topic Date Due  . INFLUENZA VACCINE  08/05/2017  . Fecal DNA (Cologuard)  09/23/2018  . TETANUS/TDAP  06/10/2024  . Hepatitis C Screening  Completed  . PNA vac Low Risk Adult  Discontinued   Immunization History  Administered Date(s) Administered  . DT 06/11/2014  . PPD Test 06/08/2013, 06/11/2014   Last Colon - Patient declines to have a colonoscope or Cologard.  Past Surgical History:  Procedure Laterality Date  . APPENDECTOMY  1981  . ORIF CLAVICLE FRACTURE Left 1970s  . REFRACTIVE SURGERY Bilateral 2011  . WRIST FRACTURE SURGERY Left 1970's   Family History  Problem Relation Age of Onset  . Thyroid disease Mother   . Hypertension Father   . Benign prostatic hyperplasia Father   . Hypertension Brother   . Heart disease Brother   . Cancer Other        lung  . Seizures Other    Social History   Socioeconomic History  . Marital status: Divorced  . Number of children: None  Occupational History  . 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  Tobacco Use  . Smoking status: Former Smoker    Last attempt to quit: 09/15/1983    Years since quitting: 33.9  . Smokeless tobacco: Never Used  Substance and Sexual Activity  .  Alcohol use: No  . Drug use: No  . Sexual activity: Active    ROS Constitutional: Denies fever, chills, weight loss/gain, headaches, insomnia,  night sweats or change in appetite. Does c/o fatigue. Eyes: Denies redness, blurred vision, diplopia, discharge, itchy or watery eyes.  ENT: Denies discharge, congestion, post nasal drip, epistaxis, sore throat, earache, hearing loss, dental pain, Tinnitus, Vertigo, Sinus pain or snoring.  Cardio: Denies chest pain, palpitations, irregular heartbeat, syncope, dyspnea, diaphoresis, orthopnea, PND, claudication or edema Respiratory: denies cough, dyspnea, DOE, pleurisy, hoarseness, laryngitis or wheezing.  Gastrointestinal: Denies dysphagia, heartburn, reflux, water brash, pain,  cramps, nausea, vomiting, bloating, diarrhea, constipation, hematemesis, melena, hematochezia, jaundice or hemorrhoids Genitourinary: Denies dysuria, frequency, urgency, nocturia, hesitancy, discharge, hematuria or flank pain Musculoskeletal: Denies arthralgia, myalgia, stiffness, Jt. Swelling, pain, limp or strain/sprain. Denies Falls. Skin: Denies puritis, rash, hives, warts, acne, eczema or change in skin lesion Neuro: No weakness, tremor, incoordination, spasms, paresthesia or pain Psychiatric: Denies confusion, memory loss or sensory loss. Denies Depression. Endocrine: Denies change in weight, skin, hair change, nocturia, and paresthesia, diabetic polys, visual blurring or hyper / hypo glycemic episodes.  Heme/Lymph: No excessive bleeding, bruising or enlarged lymph nodes.  Physical Exam  BP 124/70   Pulse 72   Temp (!) 97.3 F (36.3 C)   Resp 18   Ht 5' 8.75" (1.746 m)   Wt 211 lb 3.2 oz (95.8 kg)   BMI 31.42 kg/m   General Appearance: Well nourished and well groomed and in no apparent distress.  Eyes: PERRLA, EOMs, conjunctiva no swelling or erythema, normal fundi and vessels. Sinuses: No frontal/maxillary tenderness ENT/Mouth: EACs patent / TMs  nl. Nares clear without erythema, swelling, mucoid exudates. Oral hygiene is good. No erythema, swelling, or exudate. Tongue normal, non-obstructing. Tonsils not swollen or erythematous. Hearing normal.  Neck: Supple, thyroid not palpable. No bruits, nodes or JVD. Respiratory: Respiratory effort normal.  BS equal and clear bilateral without rales, rhonci, wheezing or stridor. Cardio: Heart sounds are normal with regular rate and rhythm and no murmurs, rubs or gallops. Peripheral pulses are normal and equal bilaterally without edema. No aortic or femoral bruits. Chest: symmetric with normal excursions and percussion.  Abdomen: Soft, with Nl bowel sounds. Nontender, no guarding, rebound, hernias, masses, or organomegaly.  Lymphatics: Non  tender without lymphadenopathy.  Genitourinary: DRE - declined by patient  Musculoskeletal: Full ROM all peripheral extremities, joint stability, 5/5 strength, and normal gait. Skin: Warm and dry without rashes, lesions, cyanosis, clubbing or  ecchymosis.  Neuro: Cranial nerves intact, reflexes equal bilaterally. Normal muscle tone, no cerebellar symptoms. Sensation intact.  Pysch: Alert and oriented X 3 with normal affect, insight and judgment appropriate.   Assessment and Plan  1. Annual Preventative/Screening Exam   2. Labile hypertension  - EKG 12-Lead - [place], RETROPERITNL ABD,  LTD - Urinalysis, Routine w reflex microscopic - Microalbumin / creatinine urine ratio - CBC with Differential/Platelet - COMPLETE METABOLIC PANEL WITH GFR - Magnesium - TSH  3. Hyperlipidemia, mixed  - EKG 12-Lead - [place], RETROPERITNL ABD,  LTD - Lipid panel  4. Abnormal glucose  - EKG 12-Lead - [place], RETROPERITNL ABD,  LTD - Hemoglobin A1c - Insulin, random  5. Vitamin D deficiency  - VITAMIN D 25 Hydroxyl  6. Screening for colorectal cancer  - POC Hemoccult Bld/Stl (3-Cd Home Screen); Future  7. Prostate cancer screening  - PSA  8. BPH with obstruction/lower urinary tract symptoms  - PSA  9. Screening for ischemic heart disease  -  EKG 12-Lead  10. FHx: heart disease  - EKG 12-Lead - US, RETROPERITNL ABD,  LTD  11. Former smoker  - EKG 12-Lead - US, RETROPERITNL ABD,  LTD  12. Screening for AAA (aortic abdominal aneurysm)  - US, RETROPERITNL ABD,  LTD  13. Medication management  - Urinalysis, Routine w reflex microscopic - Microalbumin / creatinine urine ratio - CBC with Differential/Platelet - COMPLETE METABOLIC PANEL WITH GFR - Magnesium - Lipid panel - TSH - Hemoglobin A1c - Insulin, random - VITAMIN D 25 Hydroxyl       Patient was counseled in prudent diet, weight control to achieve/maintain BMI less than 25, BP monitoring, regular exercise and medications  as discussed.  Discussed med effects and SE's including the importance of Colorectal screening. Routine screening labs and tests as requested with regular follow-up as recommended. Over 40 minutes of exam, counseling, chart review and high complex critical decision making was performed

## 2017-08-18 LAB — URINALYSIS, ROUTINE W REFLEX MICROSCOPIC
BILIRUBIN URINE: NEGATIVE
Glucose, UA: NEGATIVE
Hgb urine dipstick: NEGATIVE
Ketones, ur: NEGATIVE
Leukocytes, UA: NEGATIVE
Nitrite: NEGATIVE
Protein, ur: NEGATIVE
SPECIFIC GRAVITY, URINE: 1.025 (ref 1.001–1.03)

## 2017-08-18 LAB — LIPID PANEL
CHOL/HDL RATIO: 5.4 (calc) — AB (ref <5.0)
Cholesterol: 249 mg/dL — ABNORMAL HIGH (ref <200)
HDL: 46 mg/dL (ref >40)
LDL Cholesterol (Calc): 181 mg/dL (calc) — ABNORMAL HIGH
Non-HDL Cholesterol (Calc): 203 mg/dL (calc) — ABNORMAL HIGH (ref <130)
Triglycerides: 101 mg/dL (ref <150)

## 2017-08-18 LAB — CBC WITH DIFFERENTIAL/PLATELET
Basophils Absolute: 30 cells/uL (ref 0–200)
Basophils Relative: 0.4 %
EOS ABS: 89 {cells}/uL (ref 15–500)
Eosinophils Relative: 1.2 %
HEMATOCRIT: 45.4 % (ref 38.5–50.0)
Hemoglobin: 15.6 g/dL (ref 13.2–17.1)
LYMPHS ABS: 1813 {cells}/uL (ref 850–3900)
MCH: 31.4 pg (ref 27.0–33.0)
MCHC: 34.4 g/dL (ref 32.0–36.0)
MCV: 91.3 fL (ref 80.0–100.0)
MPV: 10.9 fL (ref 7.5–12.5)
Monocytes Relative: 8 %
Neutro Abs: 4877 cells/uL (ref 1500–7800)
Neutrophils Relative %: 65.9 %
Platelets: 234 10*3/uL (ref 140–400)
RBC: 4.97 10*6/uL (ref 4.20–5.80)
RDW: 13.2 % (ref 11.0–15.0)
Total Lymphocyte: 24.5 %
WBC: 7.4 10*3/uL (ref 3.8–10.8)
WBCMIX: 592 {cells}/uL (ref 200–950)

## 2017-08-18 LAB — MAGNESIUM: Magnesium: 2.3 mg/dL (ref 1.5–2.5)

## 2017-08-18 LAB — COMPLETE METABOLIC PANEL WITH GFR
AG RATIO: 1.7 (calc) (ref 1.0–2.5)
ALBUMIN MSPROF: 4.5 g/dL (ref 3.6–5.1)
ALT: 10 U/L (ref 9–46)
AST: 15 U/L (ref 10–35)
Alkaline phosphatase (APISO): 47 U/L (ref 40–115)
BUN: 19 mg/dL (ref 7–25)
CALCIUM: 9.7 mg/dL (ref 8.6–10.3)
CO2: 26 mmol/L (ref 20–32)
Chloride: 106 mmol/L (ref 98–110)
Creat: 1.14 mg/dL (ref 0.70–1.25)
GFR, EST AFRICAN AMERICAN: 77 mL/min/{1.73_m2} (ref >=60)
GFR, EST NON AFRICAN AMERICAN: 66 mL/min/{1.73_m2} (ref >=60)
GLOBULIN: 2.6 g/dL (ref 1.9–3.7)
Glucose, Bld: 91 mg/dL (ref 65–99)
POTASSIUM: 4.9 mmol/L (ref 3.5–5.3)
SODIUM: 142 mmol/L (ref 135–146)
Total Bilirubin: 0.4 mg/dL (ref 0.2–1.2)
Total Protein: 7.1 g/dL (ref 6.1–8.1)

## 2017-08-18 LAB — MICROALBUMIN / CREATININE URINE RATIO
Creatinine, Urine: 122 mg/dL (ref 20–320)
Microalb Creat Ratio: 2 mcg/mg creat (ref <30)
Microalb, Ur: 0.2 mg/dL

## 2017-08-18 LAB — TSH: TSH: 2.04 mIU/L (ref 0.40–4.50)

## 2017-08-18 LAB — PSA: PSA: 2.9 ng/mL (ref <=4.0)

## 2017-08-18 LAB — HEMOGLOBIN A1C
HEMOGLOBIN A1C: 5.5 %{Hb} (ref <5.7)
MEAN PLASMA GLUCOSE: 111 (calc)
eAG (mmol/L): 6.2 (calc)

## 2017-08-18 LAB — VITAMIN D 25 HYDROXY (VIT D DEFICIENCY, FRACTURES): Vit D, 25-Hydroxy: 83 ng/mL (ref 30–100)

## 2017-08-18 LAB — INSULIN, RANDOM: Insulin: 5.1 u[IU]/mL (ref 2.0–19.6)

## 2017-08-21 ENCOUNTER — Encounter: Payer: Self-pay | Admitting: Internal Medicine

## 2017-09-21 ENCOUNTER — Other Ambulatory Visit: Payer: Self-pay

## 2017-09-21 DIAGNOSIS — Z1211 Encounter for screening for malignant neoplasm of colon: Secondary | ICD-10-CM

## 2017-09-21 DIAGNOSIS — Z1212 Encounter for screening for malignant neoplasm of rectum: Principal | ICD-10-CM

## 2017-09-21 LAB — POC HEMOCCULT BLD/STL (HOME/3-CARD/SCREEN)
Card #3 Fecal Occult Blood, POC: NEGATIVE
FECAL OCCULT BLD: NEGATIVE
Fecal Occult Blood, POC: NEGATIVE

## 2017-12-10 ENCOUNTER — Ambulatory Visit: Payer: Medicare Other | Admitting: Adult Health Nurse Practitioner

## 2017-12-10 ENCOUNTER — Encounter: Payer: Self-pay | Admitting: Adult Health Nurse Practitioner

## 2017-12-10 ENCOUNTER — Ambulatory Visit: Payer: Self-pay | Admitting: Adult Health

## 2017-12-10 VITALS — BP 128/76 | HR 69 | Temp 98.1°F | Ht 68.5 in | Wt 217.2 lb

## 2017-12-10 DIAGNOSIS — R7309 Other abnormal glucose: Secondary | ICD-10-CM | POA: Insufficient documentation

## 2017-12-10 DIAGNOSIS — Z0001 Encounter for general adult medical examination with abnormal findings: Secondary | ICD-10-CM

## 2017-12-10 DIAGNOSIS — Z0000 Encounter for general adult medical examination without abnormal findings: Secondary | ICD-10-CM

## 2017-12-10 DIAGNOSIS — R6889 Other general symptoms and signs: Secondary | ICD-10-CM | POA: Diagnosis not present

## 2017-12-10 DIAGNOSIS — M26609 Unspecified temporomandibular joint disorder, unspecified side: Secondary | ICD-10-CM | POA: Insufficient documentation

## 2017-12-10 DIAGNOSIS — E559 Vitamin D deficiency, unspecified: Secondary | ICD-10-CM | POA: Diagnosis not present

## 2017-12-10 DIAGNOSIS — R0989 Other specified symptoms and signs involving the circulatory and respiratory systems: Secondary | ICD-10-CM

## 2017-12-10 DIAGNOSIS — E782 Mixed hyperlipidemia: Secondary | ICD-10-CM | POA: Diagnosis not present

## 2017-12-10 NOTE — Patient Instructions (Addendum)
Mr. Stan , Thank you for taking time to come for your Medicare Wellness Visit. I appreciate your ongoing commitment to your health goals. Please review the following plan we discussed and let me know if I can assist you in the future.    This is a list of the screening recommended for you and due dates:  Health Maintenance  Topic Date Due  . Cologuard (Stool DNA test)  09/23/2018  . Tetanus Vaccine  06/10/2024  .  Hepatitis C: One time screening is recommended by Center for Disease Control  (CDC) for  adults born from [birth year] through 1965.   Completed  . Flu Shot  Discontinued  . Pneumonia vaccines  Discontinued    Temporomandibular Joint Syndrome Temporomandibular joint (TMJ) syndrome is a condition that affects the joints between your jaw and your skull. The TMJs are located near your ears and allow your jaw to open and close. These joints and the nearby muscles are involved in all movements of the jaw. People with TMJ syndrome have pain in the area of these joints and muscles. Chewing, biting, or other movements of the jaw can be difficult or painful. TMJ syndrome can be caused by various things. In many cases, the condition is mild and goes away within a few weeks. For some people, the condition can become a long-term problem. What are the causes? Possible causes of TMJ syndrome include:  Grinding your teeth or clenching your jaw. Some people do this when they are under stress.  Arthritis.  Injury to the jaw.  Head or neck injury.  Teeth or dentures that are not aligned well.  In some cases, the cause of TMJ syndrome may not be known. What are the signs or symptoms? The most common symptom is an aching pain on the side of the head in the area of the TMJ. Other symptoms may include:  Pain when moving your jaw, such as when chewing or biting.  Being unable to open your jaw all the way.  Making a clicking sound when you open your mouth.  Headache.  Earache.  Neck or  shoulder pain.  How is this diagnosed? Diagnosis can usually be made based on your symptoms, your medical history, and a physical exam. Your health care provider may check the range of motion of your jaw. Imaging tests, such as X-rays or an MRI, are sometimes done. You may need to see your dentist to determine if your teeth and jaw are lined up correctly. How is this treated? TMJ syndrome often goes away on its own. If treatment is needed, the options may include:  Eating soft foods and applying ice or heat.  Medicines to relieve pain or inflammation.  Medicines to relax the muscles.  A splint, bite plate, or mouthpiece to prevent teeth grinding or jaw clenching.  Relaxation techniques or counseling to help reduce stress.  Transcutaneous electrical nerve stimulation (TENS). This helps to relieve pain by applying an electrical current through the skin.  Acupuncture. This is sometimes helpful to relieve pain.  Jaw surgery. This is rarely needed.  Follow these instructions at home:  Take medicines only as directed by your health care provider.  Eat a soft diet if you are having trouble chewing.  Apply ice to the painful area. ? Put ice in a plastic bag. ? Place a towel between your skin and the bag. ? Leave the ice on for 20 minutes, 2-3 times a day.  Apply a warm compress to the painful area  as directed.  Massage your jaw area and perform any jaw stretching exercises as recommended by your health care provider.  If you were given a mouthpiece or bite plate, wear it as directed.  Avoid foods that require a lot of chewing. Do not chew gum.  Keep all follow-up visits as directed by your health care provider. This is important. Contact a health care provider if:  You are having trouble eating.  You have new or worsening symptoms. Get help right away if:  Your jaw locks open or closed. This information is not intended to replace advice given to you by your health care  provider. Make sure you discuss any questions you have with your health care provider. Document Released: 09/16/2000 Document Revised: 08/22/2015 Document Reviewed: 07/27/2013 Elsevier Interactive Patient Education  2018 [employer].     We Do NOT Approve of  [facility] [employer] [facility] Patients  To Do Home Visits  & We Do NOT Approve of [employer] > > > > > > > > > > > > > > > > > > > > > > > > > > > > > > > > > > >  > > > >   Preventive Care for Adults  A healthy lifestyle and preventive care can promote health and wellness. Preventive health guidelines for men include the following key practices:  A routine yearly physical is a good way to check with your health care provider about your health and preventative screening. It is a chance to share any concerns and updates on your health and to receive a thorough exam.  Visit your dentist for a routine exam and preventative care every 6 months. Brush your teeth twice a day and floss once a day. Good oral hygiene prevents tooth decay and gum disease.  The frequency of eye exams is based on your age, health, family medical history, use of contact lenses, and other factors. Follow your health care provider's recommendations for frequency of eye exams.  Eat a healthy diet. Foods such as vegetables, fruits, whole grains, low-fat dairy products, and lean protein foods contain the nutrients you need without too many calories. Decrease your intake of foods high in solid fats, added sugars, and salt. Eat the right amount of calories for you. Get information about a proper diet from your health care provider, if necessary.  Regular physical exercise is one of the most important things you can do for your health. Most adults should get at least 150 minutes of moderate-intensity exercise (any activity that increases your heart rate and causes you to sweat) each week. In addition, most adults need muscle-strengthening  exercises on 2 or more days a week.  Maintain a healthy weight. The body mass index (BMI) is a screening tool to identify possible weight problems. It provides an estimate of body fat based on height and weight. Your health care provider can find your BMI and can help you achieve or maintain a healthy weight. For adults 20 years and older:  A BMI below 18.5 is considered underweight.  A BMI of 18.5 to 24.9 is normal.  A BMI of 25 to 29.9 is considered overweight.  A BMI of 30 and above is considered obese.  Maintain normal blood lipids and cholesterol levels by exercising and minimizing your intake of saturated fat. Eat a balanced diet with plenty of fruit and vegetables. Blood tests for lipids and cholesterol should begin at age 64 and be repeated every 5  years. If your lipid or cholesterol levels are high, you are over 50, or you are at high risk for heart disease, you may need your cholesterol levels checked more frequently. Ongoing high lipid and cholesterol levels should be treated with medicines if diet and exercise are not working.  If you smoke, find out from your health care provider how to quit. If you do not use tobacco, do not start.  Lung cancer screening is recommended for adults aged 55-80 years who are at high risk for developing lung cancer because of a history of smoking. A yearly low-dose CT scan of the lungs is recommended for people who have at least a 30-pack-year history of smoking and are a current smoker or have quit within the past 15 years. A pack year of smoking is smoking an average of 1 pack of cigarettes a day for 1 year (for example: 1 pack a day for 30 years or 2 packs a day for 15 years). Yearly screening should continue until the smoker has stopped smoking for at least 15 years. Yearly screening should be stopped for people who develop a health problem that would prevent them from having lung cancer treatment.  If you choose to drink alcohol, do not have more than  2 drinks per day. One drink is considered to be 12 ounces (355 mL) of beer, 5 ounces (148 mL) of wine, or 1.5 ounces (44 mL) of liquor.  Avoid use of street drugs. Do not share needles with anyone. Ask for help if you need support or instructions about stopping the use of drugs.  High blood pressure causes heart disease and increases the risk of stroke. Your blood pressure should be checked at least every 1-2 years. Ongoing high blood pressure should be treated with medicines, if weight loss and exercise are not effective.  If you are 3545-67 years old, ask your health care provider if you should take aspirin to prevent heart disease.  Diabetes screening involves taking a blood sample to check your fasting blood sugar level. Testing should be considered at a younger age or be carried out more frequently if you are overweight and have at least 1 risk factor for diabetes.  Colorectal cancer can be detected and often prevented. Most routine colorectal cancer screening begins at the age of 67 and continues through age 67. However, your health care provider may recommend screening at an earlier age if you have risk factors for colon cancer. On a yearly basis, your health care provider may provide home test kits to check for hidden blood in the stool. Use of a small camera at the end of a tube to directly examine the colon (sigmoidoscopy or colonoscopy) can detect the earliest forms of colorectal cancer. Talk to your health care provider about this at age 67, when routine screening begins. Direct exam of the colon should be repeated every 5-10 years through age 67, unless early forms of precancerous polyps or small growths are found.  Hepatitis C blood testing is recommended for all people born from [id number] through 1965 and any individual with known risks for hepatitis C.  Screening for abdominal aortic aneurysm (AAA)  by ultrasound is recommended for people who have history of high blood pressure or who are  current or former smokers.  Healthy men should  receive prostate-specific antigen (PSA) blood tests as part of routine cancer screening. Talk with your health care provider about prostate cancer screening.  Testicular cancer screening is  recommended for adult males.  Screening includes self-exam, a health care provider exam, and other screening tests. Consult with your health care provider about any symptoms you have or any concerns you have about testicular cancer.  Use sunscreen. Apply sunscreen liberally and repeatedly throughout the day. You should seek shade when your shadow is shorter than you. Protect yourself by wearing long sleeves, pants, a wide-brimmed hat, and sunglasses year round, whenever you are outdoors.  Once a month, do a whole-body skin exam, using a mirror to look at the skin on your back. Tell your health care provider about new moles, moles that have irregular borders, moles that are larger than a pencil eraser, or moles that have changed in shape or color.  Stay current with required vaccines (immunizations).  Influenza vaccine. All adults should be immunized every year.  Tetanus, diphtheria, and acellular pertussis (Td, Tdap) vaccine. An adult who has not previously received Tdap or who does not know his vaccine status should receive 1 dose of Tdap. This initial dose should be followed by tetanus and diphtheria toxoids (Td) booster doses every 10 years. Adults with an unknown or incomplete history of completing a 3-dose immunization series with Td-containing vaccines should begin or complete a primary immunization series including a Tdap dose. Adults should receive a Td booster every 10 years.  Zoster vaccine. One dose is recommended for adults aged 19 years or older unless certain conditions are present.    PREVNAR - Pneumococcal 13-valent conjugate (PCV13) vaccine. When indicated, a person who is uncertain of his immunization history and has no record of immunization  should receive the PCV13 vaccine. An adult aged 25 years or older who has certain medical conditions and has not been previously immunized should receive 1 dose of PCV13 vaccine. This PCV13 should be followed with a dose of pneumococcal polysaccharide (PPSV23) vaccine. The PPSV23 vaccine dose should be obtained 1 or more year(s)after the dose of PCV13 vaccine. An adult aged 64 years or older who has certain medical conditions and previously received 1 or more doses of PPSV23 vaccine should receive 1 dose of PCV13. The PCV13 vaccine dose should be obtained 1 or more years after the last PPSV23 vaccine dose.    PNEUMOVAX - Pneumococcal polysaccharide (PPSV23) vaccine. When PCV13 is also indicated, PCV13 should be obtained first. All adults aged 59 years and older should be immunized. An adult younger than age 73 years who has certain medical conditions should be immunized. Any person who resides in a nursing home or long-term care facility should be immunized. An adult smoker should be immunized. People with an immunocompromised condition and certain other conditions should receive both PCV13 and PPSV23 vaccines. People with human immunodeficiency virus (HIV) infection should be immunized as soon as possible after diagnosis. Immunization during chemotherapy or radiation therapy should be avoided. Routine use of PPSV23 vaccine is not recommended for American Indians, [address], or people younger than 65 years unless there are medical conditions that require PPSV23 vaccine. When indicated, people who have unknown immunization and have no record of immunization should receive PPSV23 vaccine. One-time revaccination 5 years after the first dose of PPSV23 is recommended for people aged 19-64 years who have chronic kidney failure, nephrotic syndrome, asplenia, or immunocompromised conditions. People who received 1-2 doses of PPSV23 before age 41 years should receive another dose of PPSV23 vaccine at age 6 years or  later if at least 5 years have passed since the previous dose. Doses of PPSV23 are not needed for people immunized with PPSV23  at or after age 10 years.    Hepatitis A vaccine. Adults who wish to be protected from this disease, have certain high-risk conditions, work with hepatitis A-infected animals, work in hepatitis A research labs, or travel to or work in countries with a high rate of hepatitis A should be immunized. Adults who were previously unvaccinated and who anticipate close contact with an international adoptee during the first 60 days after arrival in the [place] from a country with a high rate of hepatitis A should be immunized.    Hepatitis B vaccine. Adults should be immunized if they wish to be protected from this disease, have certain high-risk conditions, may be exposed to blood or other infectious body fluids, are household contacts or sex partners of hepatitis B positive people, are clients or workers in certain care facilities, or travel to or work in countries with a high rate of hepatitis B.   Preventive Service / Frequency   Ages 30 and over  Blood pressure check.  Lipid and cholesterol check.  Lung cancer screening. / Every year if you are aged 55-80 years and have a 30-pack-year history of smoking and currently smoke or have quit within the past 15 years. Yearly screening is stopped once you have quit smoking for at least 15 years or develop a health problem that would prevent you from having lung cancer treatment.  Fecal occult blood test (FOBT) of stool. You may not have to do this test if you get a colonoscopy every 10 years.  Flexible sigmoidoscopy** or colonoscopy.** / Every 5 years for a flexible sigmoidoscopy or every 10 years for a colonoscopy beginning at age 66 and continuing until age 71.  Hepatitis C blood test.** / For all people born from [birth year] through 1965 and any individual with known risks for hepatitis C.  Abdominal aortic aneurysm (AAA)  screening./ Screening current or former smokers or have Hypertension.  Skin self-exam. / Monthly.  Influenza vaccine. / Every year.  Tetanus, diphtheria, and acellular pertussis (Tdap/Td) vaccine.** / 1 dose of Td every 10 years.   Zoster vaccine.** / 1 dose for adults aged 64 years or older.         Pneumococcal 13-valent conjugate (PCV13) vaccine.    Pneumococcal polysaccharide (PPSV23) vaccine.     Hepatitis A vaccine.** / Consult your health care provider.  Hepatitis B vaccine.** / Consult your health care provider. Screening for abdominal aortic aneurysm (AAA)  by ultrasound is recommended for people who have history of high blood pressure or who are current or former smokers. ++++++++++ Recommend Adult Low Dose Aspirin or  coated  Aspirin 81 mg daily  To reduce risk of Colon Cancer 20 %,  Skin Cancer 26 % ,  Malignant Melanoma 46%  and  Pancreatic cancer 60% ++++++++++++++++++++++ Vitamin D goal  is between 70-100.  Please make sure that you are taking your Vitamin D as directed.  It is very important as a natural anti-inflammatory  helping hair, skin, and nails, as well as reducing stroke and heart attack risk.  It helps your bones and helps with mood. It also decreases numerous cancer risks so please take it as directed.  Low Vit D is associated with a 200-300% higher risk for CANCER  and 200-300% higher risk for HEART   ATTACK  &  STROKE.   .....................................[name] It is also associated with higher death rate at younger ages,  autoimmune diseases like Rheumatoid arthritis, Lupus, Multiple Sclerosis.    Also many other  serious conditions, like depression, Alzheimer's Dementia, infertility, muscle aches, fatigue, fibromyalgia - just to name a few. ++++++++++++++++++++++ Recommend the book "The END of DIETING" by Dr Monico Hoar  & the book "The END of DIABETES " by Dr Monico Hoar At [email or web address] - get book & Audio CD's    Being diabetic has a  300%  increased risk for heart attack, stroke, cancer, and alzheimer- type vascular dementia. It is very important that you work harder with diet by avoiding all foods that are white. Avoid white rice (brown & wild rice is OK), white potatoes (sweetpotatoes in moderation is OK), White bread or wheat bread or anything made out of white flour like bagels, donuts, rolls, buns, biscuits, cakes, pastries, cookies, pizza crust, and pasta (made from white flour & egg whites) - vegetarian pasta or spinach or wheat pasta is OK. Multigrain breads like Arnold's or [employer], or multigrain sandwich thins or flatbreads.  Diet, exercise and weight loss can reverse and cure diabetes in the early stages.  Diet, exercise and weight loss is very important in the control and prevention of complications of diabetes which affects every system in your body, ie. Brain - dementia/stroke, eyes - glaucoma/blindness, heart - heart attack/heart failure, kidneys - dialysis, stomach - gastric paralysis, intestines - malabsorption, nerves - severe painful neuritis, circulation - gangrene & loss of a leg(s), and finally cancer and Alzheimers.    I recommend avoid fried & greasy foods,  sweets/candy, white rice (brown or wild rice or Quinoa is OK), white potatoes (sweet potatoes are OK) - anything made from white flour - bagels, doughnuts, rolls, buns, biscuits,white and wheat breads, pizza crust and traditional pasta made of white flour & egg white(vegetarian pasta or spinach or wheat pasta is OK).  Multi-grain bread is OK - like multi-grain flat bread or sandwich thins. Avoid alcohol in excess. Exercise is also important.    Eat all the vegetables you want - avoid meat, especially red meat and dairy - especially cheese.  Cheese is the most concentrated form of trans-fats which is the worst thing to clog up our arteries. Veggie cheese is OK which can be found in the fresh produce section at [employer] or [employer] or  [employer]  ++++++++++++++++++++++ DASH Eating Plan  DASH stands for "Dietary Approaches to Stop Hypertension."   The DASH eating plan is a healthy eating plan that has been shown to reduce high blood pressure (hypertension). Additional health benefits may include reducing the risk of type 2 diabetes mellitus, heart disease, and stroke. The DASH eating plan may also help with weight loss. WHAT DO I NEED TO KNOW ABOUT THE DASH EATING PLAN? For the DASH eating plan, you will follow these general guidelines:  Choose foods with a percent daily value for sodium of less than 5% (as listed on the food label).  Use salt-free seasonings or herbs instead of table salt or sea salt.  Check with your health care provider or pharmacist before using salt substitutes.  Eat lower-sodium products, often labeled as "lower sodium" or "no salt added."  Eat fresh foods.  Eat more vegetables, fruits, and low-fat dairy products.  Choose whole grains. Look for the word "whole" as the first word in the ingredient list.  Choose fish   Limit sweets, desserts, sugars, and sugary drinks.  Choose heart-healthy fats.  Eat veggie cheese   Eat more home-cooked food and less restaurant, buffet, and fast food.  Limit fried foods.  Cook foods using methods other  than frying.  Limit canned vegetables. If you do use them, rinse them well to decrease the sodium.  When eating at a restaurant, ask that your food be prepared with less salt, or no salt if possible.                      WHAT FOODS CAN I EAT? Read Dr Francis Dowse Fuhrman's books on The End of Dieting & The End of Diabetes  Grains Whole grain or whole wheat bread. Brown rice. Whole grain or whole wheat pasta. Quinoa, bulgur, and whole grain cereals. Low-sodium cereals. Corn or whole wheat flour tortillas. Whole grain cornbread. Whole grain crackers. Low-sodium crackers.  Vegetables Fresh or frozen vegetables (raw, steamed, roasted, or grilled). Low-sodium  or reduced-sodium tomato and vegetable juices. Low-sodium or reduced-sodium tomato sauce and paste. Low-sodium or reduced-sodium canned vegetables.   Fruits All fresh, canned (in natural juice), or frozen fruits.  Protein Products  All fish and seafood.  Dried beans, peas, or lentils. Unsalted nuts and seeds. Unsalted canned beans.  Dairy Low-fat dairy products, such as skim or 1% milk, 2% or reduced-fat cheeses, low-fat ricotta or cottage cheese, or plain low-fat yogurt. Low-sodium or reduced-sodium cheeses.  Fats and Oils Tub margarines without trans fats. Light or reduced-fat mayonnaise and salad dressings (reduced sodium). Avocado. Safflower, olive, or canola oils. Natural peanut or almond butter.  Other Unsalted popcorn and pretzels. The items listed above may not be a complete list of recommended foods or beverages. Contact your dietitian for more options.  ++++++++++++++++++++  WHAT FOODS ARE NOT RECOMMENDED? Grains/ White flour or wheat flour White bread. White pasta. White rice. Refined cornbread. Bagels and croissants. Crackers that contain trans fat.  Vegetables  Creamed or fried vegetables. Vegetables in a . Regular canned vegetables. Regular canned tomato sauce and paste. Regular tomato and vegetable juices.  Fruits Dried fruits. Canned fruit in light or heavy syrup. Fruit juice.  Meat and Other Protein Products Meat in general - RED meat & White meat.  Fatty cuts of meat. Ribs, chicken wings, all processed meats as bacon, sausage, bologna, salami, fatback, hot dogs, bratwurst and packaged luncheon meats.  Dairy Whole or 2% milk, cream, half-and-half, and cream cheese. Whole-fat or sweetened yogurt. Full-fat cheeses or blue cheese. Non-dairy creamers and whipped toppings. Processed cheese, cheese spreads, or cheese curds.  Condiments Onion and garlic salt, seasoned salt, table salt, and sea salt. Canned and packaged gravies. Worcestershire sauce. Tartar sauce.  Barbecue sauce. Teriyaki sauce. Soy sauce, including reduced sodium. Steak sauce. Fish sauce. Oyster sauce. Cocktail sauce. Horseradish. Ketchup and mustard. Meat flavorings and tenderizers. Bouillon cubes. Hot sauce. Tabasco sauce. Marinades. Taco seasonings. Relishes.  Fats and Oils Butter, stick margarine, lard, shortening and bacon fat. Coconut, palm kernel, or palm oils. Regular salad dressings.  Pickles and olives. Salted popcorn and pretzels.  The items listed above may not be a complete list of foods and beverages to avoid.

## 2017-12-10 NOTE — Progress Notes (Signed)
MEDICARE ANNUAL WELLNESS VISIT AND FOLLOW UP  Assessment:   Diagnoses and all orders for this visit:  Medicare annual wellness visit, subsequent Yearly wellness visit  Morbid obesity due to excess calories (HCC) Discussed dietary and exercise modifications Discussed increasing activity  Vitamin D deficiency Continue supplementation  Abnormal glucose Discussed dietary and exercise modifications Just started lectin free diet  Hyperlipidemia, mixed Discussed dietary modifications Not interested in medication management Discussed benefits and risks  Labile hypertension No medications, controlled today DASH diet,  exercise and monitor at home  Call if greater than 140/90.   TMJ, temporomandibular joint syndrome Not painful at this time, more of an annoyance Discussed wearing night guard to protect teeth Inquire with dentistry in regards to fitted guard Avoid chewing gum May use ice / heat if becomes painful, notify office may need further interventions  Call or return with new or worsening symptoms as discussed in appointment.  May contact via office phone [phone] or MyChart.   Over 40 minutes of exam, counseling, chart review and critical decision making was performed Future Appointments  Date Time Provider Department Center  09/06/2018  2:00 PM Lucky Cowboy, MD [facility] None     Plan:   During the course of the visit the patient was educated and counseled about appropriate screening and preventive services including:    Pneumococcal vaccine   Prevnar 13  Influenza vaccine  Td vaccine  Screening electrocardiogram  Bone densitometry screening  Colorectal cancer screening  Diabetes screening  Glaucoma screening  Nutrition counseling   Advanced directives: requested   Subjective:  Kenneth Cantu is a 67 y.o. male who presents for Medicare Annual Wellness. Reports he is doing well since last visit.  He has some questions regarding  his left jaw.  Reports that is started popping when opening his mouth.  Denies any pain or discomfort at this time it is more of an annoyance.  Reports it does not happen while he is eating or sore afterward.   [name] His blood pressure has been controlled at home, today their BP is BP: 128/76 He does workout by means of walking his dogs.  He denies chest pain, shortness of breath, dizziness.  He is not on cholesterol medication and denies myalgias. His cholesterol is not at goal. The cholesterol last visit was:   Lab Results  Component Value Date   CHOL 249 (H) 08/17/2017   HDL 46 08/17/2017   LDLCALC 181 (H) 08/17/2017   TRIG 101 08/17/2017   CHOLHDL 5.4 (H) 08/17/2017   He has not been working on diet and exercise for prediabetes, and denies nausea, paresthesia of the feet, polydipsia, polyuria and visual disturbances. Last A1C in the office was:  Lab Results  Component Value Date   HGBA1C 5.5 08/17/2017   Last GFR:   Lab Results  Component Value Date   GFRNONAA 66 08/17/2017   Lab Results  Component Value Date   GFRAA 77 08/17/2017   Patient is on Vitamin D supplement.   Lab Results  Component Value Date   VD25OH 83 08/17/2017      Medication Review: Current Outpatient Medications on File Prior to Visit  Medication Sig Dispense Refill  . cholecalciferol (VITAMIN D) 1000 units tablet Take 1,000 Units by mouth 2 (two) times daily.    . Multiple Vitamins-Minerals (MULTIVITAMIN & MINERAL PO) Take by mouth daily.     No current facility-administered medications on file prior to visit.     Not on File  Current  Problems (verified) Patient Active Problem List   Diagnosis Date Noted  . Prediabetes 06/11/2014  . Testosterone deficiency 06/11/2014  . Medication management 06/08/2013  . Morbid obesity (BMI 32.44)  06/08/2013  . Hyperlipidemia   . Labile hypertension   . Vitamin D deficiency     Screening Tests Immunization History  Administered Date(s) Administered  .  DT 06/11/2014  . PPD Test 06/08/2013, 06/11/2014    Preventative care: Last colonoscopy: Cologuard 2017  Prior vaccinations: TD or Tdap: 2016  Influenza: Declined Pneumococcal: Declined Prevnar13: Declined Shingles/Zostavax:Declined   Names of Other Physician/Practitioners you currently use: 1. [facility] here for primary care 2.Eye Exam 10/2017 3.Dentist 2019  Patient Care Team: Lucky CowboyMcKeown, William, MD as PCP - General (Internal Medicine) Louis MeckelKaplan, Robert D, MD (Inactive) as Consulting Physician (Gastroenterology)  SURGICAL HISTORY He  has a past surgical history that includes Appendectomy (1981); Refractive surgery (Bilateral, 2011); Wrist fracture surgery (Left, 1970's); and ORIF clavicle fracture (Left, 1970s). FAMILY HISTORY His family history includes Benign prostatic hyperplasia in his father; Cancer in his other; Heart disease in his brother; Hypertension in his brother and father; Seizures in his other; Thyroid disease in his mother. SOCIAL HISTORY He  reports that he quit smoking about 34 years ago. He has never used smokeless tobacco. He reports that he does not drink alcohol or use drugs.   MEDICARE WELLNESS OBJECTIVES: Physical activity: Current Exercise Habits: Home exercise routine, Type of exercise: walking, Time (Minutes): 30, Frequency (Times/Week): 7, Weekly Exercise (Minutes/Week): 210, Intensity: Mild, Exercise limited by: None identified Cardiac risk factors: Cardiac Risk Factors include: none Depression/mood screen:   [facility] 12/10/2017  Decreased Interest 0  Down, Depressed, Hopeless 0  PHQ - 2 Score 0  Altered sleeping -  Tired, decreased energy -  Change in appetite -  Feeling bad or failure about yourself  -  Trouble concentrating -  Moving slowly or fidgety/restless -  Suicidal thoughts -  PHQ-9 Score -  Difficult doing work/chores -    ADLs:  In your present state of health, do you have  any difficulty performing the following activities: 12/10/2017 12/10/2017  Hearing? N N  Vision? N -  Difficulty concentrating or making decisions? N -  Walking or climbing stairs? N -  Dressing or bathing? N -  Doing errands, shopping? N -  Preparing Food and eating ? N -  Using the Toilet? N -  In the past six months, have you accidently leaked urine? N -  Do you have problems with loss of bowel control? N -  Managing your Medications? N -  Managing your Finances? N -  Housekeeping or managing your Housekeeping? N -  Some recent data might be hidden     Cognitive Testing  Alert? Yes  Normal Appearance?Yes  Oriented to person? Yes  Place? Yes   Time? Yes  Recall of three objects?  Yes  Can perform simple calculations? Yes  Displays appropriate judgment?Yes  Can read the correct time from a watch face?Yes  EOL planning: Does Patient Have a Medical Advance Directive?: Yes Type of Advance Directive: Healthcare Power of Attorney, Living will Copy of Healthcare Power of Attorney in Chart?: No - copy requested  Review of Systems  Constitutional: Negative for chills, diaphoresis, fever, malaise/fatigue and weight loss.  HENT: Negative for congestion, ear discharge, ear pain, hearing loss, nosebleeds, sinus pain, sore throat and tinnitus.   Eyes: Negative for blurred vision, double vision, photophobia, pain,  discharge and redness.  Respiratory: Negative for cough, hemoptysis, sputum production, shortness of breath, wheezing and stridor.   Cardiovascular: Negative for chest pain, palpitations, orthopnea, claudication, leg swelling and PND.  Gastrointestinal: Negative for abdominal pain, blood in stool, constipation, diarrhea, heartburn, melena, nausea and vomiting.  Genitourinary: Negative for dysuria, flank pain, frequency, hematuria and urgency.  Musculoskeletal: Negative for back pain, falls, joint pain, myalgias and neck pain.       Left jaw pops when opening mouth, non tedner and is  not painful.  Endorses grinding his teeth as reported by his significant other.  Skin: Negative for itching and rash.     Objective:     Today's Vitals   12/10/17 1004  BP: 128/76  Pulse: 69  Temp: 98.1 F (36.7 C)  SpO2: 98%  Weight: 217 lb 3.2 oz (98.5 kg)  Height: 5' 8.5" (1.74 m)   Body mass index is 32.54 kg/m.  General appearance: alert, no distress, WD/WN, male HEENT: normocephalic, sclerae anicteric, TMs pearly, nares patent, no discharge or erythema, pharynx normal Oral cavity: MMM, no lesions Neck: supple, no lymphadenopathy, no thyromegaly, no masses Heart: RRR, normal S1, S2, no murmurs Lungs: CTA bilaterally, no wheezes, rhonchi, or rales Abdomen: +bs, soft, non tender, non distended, no masses, no hepatomegaly, no splenomegaly Musculoskeletal: nontender, no swelling, no obvious deformity.left crepitus noted, no tenderness. Extremities: no edema, no cyanosis, no clubbing Pulses: 2+ symmetric, upper and lower extremities, normal cap refill Neurological: alert, oriented x 3, CN2-12 intact, strength normal upper extremities and lower extremities, sensation normal throughout, DTRs 2+ throughout, no cerebellar signs, gait normal Psychiatric: normal affect, behavior normal, pleasant   Medicare Attestation I have personally reviewed: The patient's medical and social history Their use of alcohol, tobacco or illicit drugs Their current medications and supplements The patient's functional ability including ADLs,fall risks, home safety risks, cognitive, and hearing and visual impairment Diet and physical activities Evidence for depression or mood disorders  The patient's weight, height, BMI, and visual acuity have been recorded in the chart.  I have made referrals, counseling, and provided education to the patient based on review of the above and I have provided the patient with a written personalized care plan for preventive services.     Elder Negus,  NP   12/10/2017

## 2018-01-10 ENCOUNTER — Encounter: Payer: Self-pay | Admitting: Physician Assistant

## 2018-01-10 DIAGNOSIS — Z6832 Body mass index (BMI) 32.0-32.9, adult: Secondary | ICD-10-CM | POA: Insufficient documentation

## 2018-09-06 ENCOUNTER — Other Ambulatory Visit: Payer: Self-pay

## 2018-09-06 ENCOUNTER — Encounter: Payer: Self-pay | Admitting: Internal Medicine

## 2018-09-06 ENCOUNTER — Ambulatory Visit: Payer: Medicare Other | Admitting: Internal Medicine

## 2018-09-06 VITALS — BP 138/82 | HR 60 | Temp 98.1°F | Ht 68.5 in | Wt 209.6 lb

## 2018-09-06 DIAGNOSIS — Z87891 Personal history of nicotine dependence: Secondary | ICD-10-CM

## 2018-09-06 DIAGNOSIS — R7309 Other abnormal glucose: Secondary | ICD-10-CM

## 2018-09-06 DIAGNOSIS — Z8249 Family history of ischemic heart disease and other diseases of the circulatory system: Secondary | ICD-10-CM

## 2018-09-06 DIAGNOSIS — N138 Other obstructive and reflux uropathy: Secondary | ICD-10-CM

## 2018-09-06 DIAGNOSIS — Z0000 Encounter for general adult medical examination without abnormal findings: Secondary | ICD-10-CM

## 2018-09-06 DIAGNOSIS — Z79899 Other long term (current) drug therapy: Secondary | ICD-10-CM

## 2018-09-06 DIAGNOSIS — R7303 Prediabetes: Secondary | ICD-10-CM

## 2018-09-06 DIAGNOSIS — I10 Essential (primary) hypertension: Secondary | ICD-10-CM

## 2018-09-06 DIAGNOSIS — Z0001 Encounter for general adult medical examination with abnormal findings: Secondary | ICD-10-CM

## 2018-09-06 DIAGNOSIS — Z1211 Encounter for screening for malignant neoplasm of colon: Secondary | ICD-10-CM

## 2018-09-06 DIAGNOSIS — Z125 Encounter for screening for malignant neoplasm of prostate: Secondary | ICD-10-CM

## 2018-09-06 DIAGNOSIS — R0989 Other specified symptoms and signs involving the circulatory and respiratory systems: Secondary | ICD-10-CM

## 2018-09-06 DIAGNOSIS — E559 Vitamin D deficiency, unspecified: Secondary | ICD-10-CM

## 2018-09-06 DIAGNOSIS — E782 Mixed hyperlipidemia: Secondary | ICD-10-CM

## 2018-09-06 DIAGNOSIS — Z136 Encounter for screening for cardiovascular disorders: Secondary | ICD-10-CM

## 2018-09-06 NOTE — Patient Instructions (Signed)

## 2018-09-06 NOTE — Progress Notes (Signed)
Annual  Screening/Preventative Visit  & Comprehensive Evaluation & Examination   ** PATIENT REFUSES RECOMMENDED MEDICARE WELLNESS VISITS DESPITE RECOMMENDATIONS**     This very nice 68 y.o. [name] presents for a Screening /Preventative Visit & comprehensive evaluation and management of multiple medical co-morbidities.  Patient has been followed for HTN, HLD, Prediabetes and Vitamin D Deficiency.     Labile HTN predates since 1995. And today's BP is at goal -  138/82. Patient denies any cardiac symptoms as chest pain, palpitations, shortness of breath, dizziness or ankle swelling.     Patient's hyperlipidemia is not controlled with diet and he refuses to consider taking meds for Cholesterol.  Last lipids were not at goal: Lab Results  Component Value Date   CHOL 249 (H) 08/17/2017   HDL 46 08/17/2017   LDLCALC 181 (H) 08/17/2017   TRIG 101 08/17/2017   CHOLHDL 5.4 (H) 08/17/2017      Patient  Is moderately overweight (BMI 31+) and has hx/o prediabetes (A1c 5.8% / 2012) and patient denies reactive hypoglycemic symptoms, visual blurring, diabetic polys or paresthesias. Last A1c was Normal & at goal: Lab Results  Component Value Date   HGBA1C 5.5 08/17/2017       Finally, patient has history of Vitamin D Deficiency ("43" / 2011)  and last vitamin D was at goal: Lab Results  Component Value Date   VD25OH 83 08/17/2017   Current Outpatient Medications on File Prior to Visit  Medication Sig  . ASPIRIN 81 PO Take by mouth daily.  . cholecalciferol (VITAMIN D) 1000 units tablet Take 1,000 Units by mouth 2 (two) times daily.  . Multiple Vitamins-Minerals (MULTIVITAMIN & MINERAL PO) Take by mouth daily.  . TURMERIC PO Take by mouth daily.   No current facility-administered medications on file prior to visit.    Not on File Past Medical History:  Diagnosis Date  . Labile hypertension   . Mixed hyperlipidemia   . Vitamin D deficiency    Health Maintenance  Topic Date Due  . Fecal  DNA (Cologuard)  09/23/2018  . TETANUS/TDAP  06/10/2024  . Hepatitis C Screening  Completed  . INFLUENZA VACCINE  Discontinued  . PNA vac Low Risk Adult  Discontinued   Immunization History  Administered Date(s) Administered  . DT 06/11/2014  . PPD Test 06/08/2013, 06/11/2014   - 09/23/2015 - Cologard Negative and 3 yr f/u due this month.   Past Surgical History:  Procedure Laterality Date  . APPENDECTOMY  1981  . ORIF CLAVICLE FRACTURE Left 1970s  . REFRACTIVE SURGERY Bilateral 2011  . WRIST FRACTURE SURGERY Left 1970's   Family History  Problem Relation Age of Onset  . Thyroid disease Mother   . Hypertension Father   . Benign prostatic hyperplasia Father   . Hypertension Brother   . Heart disease Brother   . Cancer Other        lung  . Seizures Other    Social History   Socioeconomic History  . Marital status: Divorced  . Number of children: None  Occupational History  . Retired EMT  Tobacco Use  . Smoking status: Former Smoker    Quit date: 09/15/1983    Years since quitting: 35.0  . Smokeless tobacco: Never Used  Substance and Sexual Activity  . Alcohol use: No  . Drug use: No  . Sexual activity: Not on file    ROS Constitutional: Denies fever, chills, weight loss/gain, headaches, insomnia,  night sweats or change in appetite. Does  c/o fatigue. Eyes: Denies redness, blurred vision, diplopia, discharge, itchy or watery eyes.  ENT: Denies discharge, congestion, post nasal drip, epistaxis, sore throat, earache, hearing loss, dental pain, Tinnitus, Vertigo, Sinus pain or snoring.  Cardio: Denies chest pain, palpitations, irregular heartbeat, syncope, dyspnea, diaphoresis, orthopnea, PND, claudication or edema Respiratory: denies cough, dyspnea, DOE, pleurisy, hoarseness, laryngitis or wheezing.  Gastrointestinal: Denies dysphagia, heartburn, reflux, water brash, pain, cramps, nausea, vomiting, bloating, diarrhea, constipation, hematemesis, melena, hematochezia,  jaundice or hemorrhoids Genitourinary: Denies dysuria, frequency, urgency, nocturia, hesitancy, discharge, hematuria or flank pain Musculoskeletal: Denies arthralgia, myalgia, stiffness, Jt. Swelling, pain, limp or strain/sprain. Denies Falls. Skin: Denies puritis, rash, hives, warts, acne, eczema or change in skin lesion Neuro: No weakness, tremor, incoordination, spasms, paresthesia or pain Psychiatric: Denies confusion, memory loss or sensory loss. Denies Depression. Endocrine: Denies change in weight, skin, hair change, nocturia, and paresthesia, diabetic polys, visual blurring or hyper / hypo glycemic episodes.  Heme/Lymph: No excessive bleeding, bruising or enlarged lymph nodes.  Physical Exam  BP 138/82   Pulse 60   Temp 98.1 F (36.7 C)   Ht 5' 8.5" (1.74 m)   Wt 209 lb 9.6 oz (95.1 kg)   SpO2 98%   BMI 31.41 kg/m   General Appearance: Well nourished and well groomed and in no apparent distress.  Eyes: PERRLA, EOMs, conjunctiva no swelling or erythema, normal fundi and vessels. Sinuses: No frontal/maxillary tenderness ENT/Mouth: EACs patent / TMs  nl. Nares clear without erythema, swelling, mucoid exudates. Oral hygiene is good. No erythema, swelling, or exudate. Tongue normal, non-obstructing. Tonsils not swollen or erythematous. Hearing normal.  Neck: Supple, thyroid not palpable. No bruits, nodes or JVD. Respiratory: Respiratory effort normal.  BS equal and clear bilateral without rales, rhonci, wheezing or stridor. Cardio: Heart sounds are normal with regular rate and rhythm and no murmurs, rubs or gallops. Peripheral pulses are normal and equal bilaterally without edema. No aortic or femoral bruits. Chest: symmetric with normal excursions and percussion.  Abdomen: Soft, with Nl bowel sounds. Nontender, no guarding, rebound, hernias, masses, or organomegaly.  Lymphatics: Non tender without lymphadenopathy.  Musculoskeletal: Full ROM all peripheral extremities, joint  stability, 5/5 strength, and normal gait. Skin: Warm and dry without rashes, lesions, cyanosis, clubbing or  ecchymosis.  Neuro: Cranial nerves intact, reflexes equal bilaterally. Normal muscle tone, no cerebellar symptoms. Sensation intact.  Pysch: Alert and oriented X 3 with normal affect, insight and judgment appropriate.   Assessment and Plan  1. Annual Preventative/Screening Exam   2. Labile hypertension  - EKG 12-Lead - [place], RETROPERITNL ABD,  LTD - Urinalysis, Routine w reflex microscopic - Microalbumin / creatinine urine ratio - CBC with Differential/Platelet - COMPLETE METABOLIC PANEL WITH GFR - Magnesium - TSH  3. Hyperlipidemia, mixed  - EKG 12-Lead - [place], RETROPERITNL ABD,  LTD - Lipid panel - TSH  4. Abnormal glucose  - EKG 12-Lead - [place], RETROPERITNL ABD,  LTD - Hemoglobin A1c - Insulin, random  5. Vitamin D deficiency  - VITAMIN D 25 Hydroxyl  6. BPH with obstruction/lower urinary tract symptoms  - PSA  7. Prostate cancer screening  - PSA  8. Screening for colorectal cancer  - Cologuard  9. Screening for ischemic heart disease  - EKG 12-Lead  10. FHx: heart disease  - EKG 12-Lead - [place], RETROPERITNL ABD,  LTD  11. Former smoker  - EKG 12-Lead - [place], RETROPERITNL ABD,  LTD  12. Screening for AAA (aortic abdominal aneurysm)  - [place], RETROPERITNL  ABD,  LTD  13. Medication management  - Urinalysis, Routine w reflex microscopic - Microalbumin / creatinine urine ratio - CBC with Differential/Platelet - COMPLETE METABOLIC PANEL WITH GFR - Magnesium - Lipid panel - TSH - Hemoglobin A1c - Insulin, random - VITAMIN D 25 Hydroxyl        Patient was counseled in prudent diet, weight control to achieve/maintain BMI less than 25, BP monitoring, regular exercise and medications as discussed.  Discussed med effects and SE's. Routine screening labs and tests as requested with regular follow-up as recommended. Over 40 minutes of exam, counseling,  chart review and high complex critical decision making was performed   Marinus MawWilliam D Shyam Dawson, MD

## 2018-09-07 LAB — URINALYSIS, ROUTINE W REFLEX MICROSCOPIC
Bilirubin Urine: NEGATIVE
Glucose, UA: NEGATIVE
Hgb urine dipstick: NEGATIVE
Ketones, ur: NEGATIVE
Leukocytes,Ua: NEGATIVE
Nitrite: NEGATIVE
Protein, ur: NEGATIVE
Specific Gravity, Urine: 1.021 (ref 1.001–1.03)
pH: <=5 (ref 5.0–8.0)

## 2018-09-07 LAB — MICROALBUMIN / CREATININE URINE RATIO
Creatinine, Urine: 155 mg/dL (ref 20–320)
Microalb Creat Ratio: 2 mcg/mg creat (ref <30)
Microalb, Ur: 0.3 mg/dL

## 2018-09-07 LAB — COMPLETE METABOLIC PANEL WITH GFR
AG Ratio: 1.7 (calc) (ref 1.0–2.5)
ALT: 10 U/L (ref 9–46)
AST: 16 U/L (ref 10–35)
Albumin: 4.3 g/dL (ref 3.6–5.1)
Alkaline phosphatase (APISO): 42 U/L (ref 35–144)
BUN: 14 mg/dL (ref 7–25)
CO2: 24 mmol/L (ref 20–32)
Calcium: 9.6 mg/dL (ref 8.6–10.3)
Chloride: 106 mmol/L (ref 98–110)
Creat: 1.04 mg/dL (ref 0.70–1.25)
GFR, Est African American: 85 mL/min/{1.73_m2} (ref >=60)
GFR, Est Non African American: 73 mL/min/{1.73_m2} (ref >=60)
Globulin: 2.5 g/dL (calc) (ref 1.9–3.7)
Glucose, Bld: 82 mg/dL (ref 65–99)
Potassium: 4.3 mmol/L (ref 3.5–5.3)
Sodium: 141 mmol/L (ref 135–146)
Total Bilirubin: 0.4 mg/dL (ref 0.2–1.2)
Total Protein: 6.8 g/dL (ref 6.1–8.1)

## 2018-09-07 LAB — CBC WITH DIFFERENTIAL/PLATELET
Absolute Monocytes: 585 cells/uL (ref 200–950)
Basophils Absolute: 30 cells/uL (ref 0–200)
Basophils Relative: 0.4 %
Eosinophils Absolute: 59 cells/uL (ref 15–500)
Eosinophils Relative: 0.8 %
HCT: 45.2 % (ref 38.5–50.0)
Hemoglobin: 15.3 g/dL (ref 13.2–17.1)
Lymphs Abs: 1754 cells/uL (ref 850–3900)
MCH: 31.4 pg (ref 27.0–33.0)
MCHC: 33.8 g/dL (ref 32.0–36.0)
MCV: 92.6 fL (ref 80.0–100.0)
MPV: 10.8 fL (ref 7.5–12.5)
Monocytes Relative: 7.9 %
Neutro Abs: 4973 cells/uL (ref 1500–7800)
Neutrophils Relative %: 67.2 %
Platelets: 253 10*3/uL (ref 140–400)
RBC: 4.88 10*6/uL (ref 4.20–5.80)
RDW: 13.2 % (ref 11.0–15.0)
Total Lymphocyte: 23.7 %
WBC: 7.4 10*3/uL (ref 3.8–10.8)

## 2018-09-07 LAB — VITAMIN D 25 HYDROXY (VIT D DEFICIENCY, FRACTURES): Vit D, 25-Hydroxy: 83 ng/mL (ref 30–100)

## 2018-09-07 LAB — LIPID PANEL
Cholesterol: 215 mg/dL — ABNORMAL HIGH (ref <200)
HDL: 47 mg/dL (ref >=40)
LDL Cholesterol (Calc): 148 mg/dL (calc) — ABNORMAL HIGH
Non-HDL Cholesterol (Calc): 168 mg/dL (calc) — ABNORMAL HIGH (ref <130)
Total CHOL/HDL Ratio: 4.6 (calc) (ref <5.0)
Triglycerides: 95 mg/dL (ref <150)

## 2018-09-07 LAB — HEMOGLOBIN A1C
Hgb A1c MFr Bld: 5.4 % of total Hgb (ref <5.7)
Mean Plasma Glucose: 108 (calc)
eAG (mmol/L): 6 (calc)

## 2018-09-07 LAB — PSA: PSA: 2 ng/mL (ref <=4.0)

## 2018-09-07 LAB — TSH: TSH: 1.43 mIU/L (ref 0.40–4.50)

## 2018-09-07 LAB — MAGNESIUM: Magnesium: 2.1 mg/dL (ref 1.5–2.5)

## 2018-09-07 LAB — INSULIN, RANDOM: Insulin: 2.7 u[IU]/mL

## 2018-09-11 ENCOUNTER — Encounter: Payer: Self-pay | Admitting: Internal Medicine

## 2018-10-19 LAB — COLOGUARD: Cologuard: NEGATIVE

## 2018-10-20 ENCOUNTER — Encounter: Payer: Self-pay | Admitting: *Deleted

## 2018-12-20 ENCOUNTER — Ambulatory Visit: Payer: Self-pay | Admitting: Adult Health Nurse Practitioner

## 2019-09-24 ENCOUNTER — Encounter: Payer: Self-pay | Admitting: Internal Medicine

## 2019-09-24 NOTE — Patient Instructions (Signed)

## 2019-09-24 NOTE — Progress Notes (Signed)
Annual  Screening/Preventative Visit  & Comprehensive Evaluation & Examination  (Patient has  Refused Recommended  Medicare Wellness Visits in the past)     This very nice 69 y.o.  [name] presents for a Screening / Preventative Visit & comprehensive evaluation and management of multiple medical co-morbidities.  Patient has been followed for labile HTN, HLD, Prediabetes and Vitamin D Deficiency.      Patient has been followed for labile HTN since  1995. Patient's BP has been controlled at home.  Today's BP at goal -  122/68. Patient denies any cardiac symptoms as chest pain, palpitations, shortness of breath, dizziness or ankle swelling.      Patient's hyperlipidemia is not controlled with diet as patient is reticent to take meds for Cholesterol. Last lipids were not at goal:  Lab Results  Component Value Date   CHOL 215 (H) 09/06/2018   HDL 47 09/06/2018   LDLCALC 148 (H) 09/06/2018   TRIG 95 09/06/2018   CHOLHDL 4.6 09/06/2018       Patient has moderate Obesity (BMI 31+) and consequent prediabetes  (A1c 5.8% 2012) and patient denies reactive hypoglycemic symptoms, visual blurring, diabetic polys or paresthesias. Last A1c was  Lab Results  Component Value Date   HGBA1C 5.4 09/06/2018        Patient has history of Vitamin D Deficiency ("43" /2011) and last vitamin D was at goal:  Lab Results  Component Value Date   VD25OH 83 09/06/2018    Current Outpatient Medications on File Prior to Visit  Medication Sig  . ASPIRIN 81 mg Take  daily.  [name] VITAMIN D 2,000 units  Take 2,000 Units  2 x/day times daily. (=4,000 units/day)  . MultiVit w/Minerals  Take  daily.  . TURMERIC  Take  daily.    Past Medical History:  Diagnosis Date  . Labile hypertension   . Mixed hyperlipidemia   . Vitamin D deficiency    Health Maintenance  Topic Date Due  . COVID-19 Vaccine (1) Never done  . Fecal DNA (Cologuard)  10/18/2021  . TETANUS/TDAP  06/10/2024  . Hepatitis C Screening   Completed  . INFLUENZA VACCINE  Discontinued  . PNA vac Low Risk Adult  Discontinued   Immunization History  Administered Date(s) Administered  . DT (Pediatric) 06/11/2014  . PPD Test 06/08/2013, 06/11/2014    - Cologard Negative - 09/23/2015- and 3 yr f/u due Sept 2020, but patient never returned Cologard kit   Past Surgical History:  Procedure Laterality Date  . APPENDECTOMY  1981  . ORIF CLAVICLE FRACTURE Left 1970s  . REFRACTIVE SURGERY Bilateral 2011  . WRIST FRACTURE SURGERY Left 1970's   Family History  Problem Relation Age of Onset  . Thyroid disease Mother   . Hypertension Father   . Benign prostatic hyperplasia Father   . Hypertension Brother   . Heart disease Brother   . Cancer Other        lung  . Seizures Other    Social History   Socioeconomic History  . Marital status: Single    Spouse name: Not on file  . Number of children: Not on file  Occupational History  . Retired EMT  Tobacco Use  . Smoking status: Former Smoker    Quit date: 09/15/1983    Years since quitting: 36.0  . Smokeless tobacco: Never Used  Substance and Sexual Activity  . Alcohol use: No  . Drug use: No  . Sexual activity: Not on file  ROS Constitutional: Denies fever, chills, weight loss/gain, headaches, insomnia,  night sweats or change in appetite. Does c/o fatigue. Eyes: Denies redness, blurred vision, diplopia, discharge, itchy or watery eyes.  ENT: Denies discharge, congestion, post nasal drip, epistaxis, sore throat, earache, hearing loss, dental pain, Tinnitus, Vertigo, Sinus pain or snoring.  Cardio: Denies chest pain, palpitations, irregular heartbeat, syncope, dyspnea, diaphoresis, orthopnea, PND, claudication or edema Respiratory: denies cough, dyspnea, DOE, pleurisy, hoarseness, laryngitis or wheezing.  Gastrointestinal: Denies dysphagia, heartburn, reflux, water brash, pain, cramps, nausea, vomiting, bloating, diarrhea, constipation, hematemesis, melena, hematochezia,  jaundice or hemorrhoids Genitourinary: Denies dysuria, frequency, discharge, hematuria or flank pain. Has urgency, nocturia x 2-3 & occasional hesitancy. Musculoskeletal: Denies arthralgia, myalgia, stiffness, Jt. Swelling, pain, limp or strain/sprain. Denies Falls. Skin: Denies puritis, rash, hives, warts, acne, eczema or change in skin lesion Neuro: No weakness, tremor, incoordination, spasms, paresthesia or pain Psychiatric: Denies confusion, memory loss or sensory loss. Denies Depression. Endocrine: Denies change in weight, skin, hair change, nocturia, and paresthesia, diabetic polys, visual blurring or hyper / hypo glycemic episodes.  Heme/Lymph: No excessive bleeding, bruising or enlarged lymph nodes.  Physical Exam  BP 122/68   Pulse 67   Temp 97.9 F (36.6 C)   Ht 5' 8"  (1.727 m)   Wt 219 lb (99.3 kg)   SpO2 97%   BMI 33.30 kg/m   General Appearance: Well nourished and well groomed and in no apparent distress.  Eyes: PERRLA, EOMs, conjunctiva no swelling or erythema, normal fundi and vessels. Sinuses: No frontal/maxillary tenderness ENT/Mouth: EACs patent / TMs  nl. Nares clear without erythema, swelling, mucoid exudates. Oral hygiene is good. No erythema, swelling, or exudate. Tongue normal, non-obstructing. Tonsils not swollen or erythematous. Hearing normal.  Neck: Supple, thyroid not palpable. No bruits, nodes or JVD. Respiratory: Respiratory effort normal.  BS equal and clear bilateral without rales, rhonci, wheezing or stridor. Cardio: Heart sounds are normal with regular rate and rhythm and no murmurs, rubs or gallops. Peripheral pulses are normal and equal bilaterally without edema. No aortic or femoral bruits. Chest: symmetric with normal excursions and percussion.  Abdomen: Soft, with Nl bowel sounds. Nontender, no guarding, rebound, hernias, masses, or organomegaly.  Lymphatics: Non tender without lymphadenopathy.  Musculoskeletal: Full ROM all peripheral  extremities, joint stability, 5/5 strength, and normal gait. Skin: Warm and dry without rashes, lesions, cyanosis, clubbing or  ecchymosis.  Neuro: Cranial nerves intact, reflexes equal bilaterally. Normal muscle tone, no cerebellar symptoms. Sensation intact.  Pysch: Alert and oriented X 3 with normal affect, insight and judgment appropriate.   Assessment and Plan  1. Annual Preventative/Screening Exam    2. Labile hypertension  - EKG 12-Lead - [place], RETROPERITNL ABD,  LTD - Urinalysis, Routine w reflex microscopic - Microalbumin / creatinine urine ratio - CBC with Differential/Platelet - COMPLETE METABOLIC PANEL WITH GFR - Magnesium - TSH  3. Hyperlipidemia, mixed  - EKG 12-Lead - [place], RETROPERITNL ABD,  LTD - Lipid panel - TSH  4. Abnormal glucose  - EKG 12-Lead - [place], RETROPERITNL ABD,  LTD - Hemoglobin A1c - Insulin, random  5. Vitamin D deficiency  - VITAMIN D 25 Hydroxy  6. BPH with obstruction/lower urinary tract symptoms  - PSA  7. Screening for colorectal cancer  - Cologuard  8. Prostate cancer screening  - PSA  9. Screening for ischemic heart disease  - EKG 12-Lead  10. FHx: heart disease  - EKG 12-Lead - [place], RETROPERITNL ABD,  LTD  11. Former smoker  -  EKG 12-Lead - [place], RETROPERITNL ABD,  LTD  12. Screening for AAA   - [place], RETROPERITNL ABD,  LTD  13. Medication management  - Urinalysis, Routine w reflex microscopic - Microalbumin / creatinine urine ratio - CBC with Differential/Platelet - COMPLETE METABOLIC PANEL WITH GFR - Magnesium - Lipid panel - TSH - Hemoglobin A1c - Insulin, random - VITAMIN D 25 Hydroxy          Patient was counseled in prudent diet, weight control to achieve/maintain BMI less than 25, BP monitoring, regular exercise and medications as discussed.  Discussed med effects and SE's. Routine screening labs and tests as requested with regular follow-up as recommended. Over 40 minutes of exam, counseling,  chart review and high complex critical decision making was performed   Kirtland Bouchard, MD

## 2019-09-25 ENCOUNTER — Other Ambulatory Visit: Payer: Self-pay

## 2019-09-25 ENCOUNTER — Encounter: Payer: Self-pay | Admitting: Internal Medicine

## 2019-09-25 ENCOUNTER — Ambulatory Visit: Payer: Medicare Other | Admitting: Internal Medicine

## 2019-09-25 VITALS — BP 122/68 | HR 67 | Temp 97.9°F | Ht 68.0 in | Wt 219.0 lb

## 2019-09-25 DIAGNOSIS — Z125 Encounter for screening for malignant neoplasm of prostate: Secondary | ICD-10-CM

## 2019-09-25 DIAGNOSIS — Z0000 Encounter for general adult medical examination without abnormal findings: Secondary | ICD-10-CM | POA: Diagnosis not present

## 2019-09-25 DIAGNOSIS — Z1211 Encounter for screening for malignant neoplasm of colon: Secondary | ICD-10-CM

## 2019-09-25 DIAGNOSIS — R7309 Other abnormal glucose: Secondary | ICD-10-CM

## 2019-09-25 DIAGNOSIS — N138 Other obstructive and reflux uropathy: Secondary | ICD-10-CM

## 2019-09-25 DIAGNOSIS — Z8249 Family history of ischemic heart disease and other diseases of the circulatory system: Secondary | ICD-10-CM | POA: Diagnosis not present

## 2019-09-25 DIAGNOSIS — R0989 Other specified symptoms and signs involving the circulatory and respiratory systems: Secondary | ICD-10-CM

## 2019-09-25 DIAGNOSIS — Z136 Encounter for screening for cardiovascular disorders: Secondary | ICD-10-CM | POA: Diagnosis not present

## 2019-09-25 DIAGNOSIS — Z79899 Other long term (current) drug therapy: Secondary | ICD-10-CM

## 2019-09-25 DIAGNOSIS — E559 Vitamin D deficiency, unspecified: Secondary | ICD-10-CM

## 2019-09-25 DIAGNOSIS — Z87891 Personal history of nicotine dependence: Secondary | ICD-10-CM

## 2019-09-25 DIAGNOSIS — E782 Mixed hyperlipidemia: Secondary | ICD-10-CM

## 2019-09-25 DIAGNOSIS — Z1212 Encounter for screening for malignant neoplasm of rectum: Secondary | ICD-10-CM

## 2019-09-25 DIAGNOSIS — Z0001 Encounter for general adult medical examination with abnormal findings: Secondary | ICD-10-CM

## 2019-09-26 LAB — CBC WITH DIFFERENTIAL/PLATELET
Absolute Monocytes: 631 cells/uL (ref 200–950)
Basophils Absolute: 50 cells/uL (ref 0–200)
Basophils Relative: 0.6 %
Eosinophils Absolute: 100 cells/uL (ref 15–500)
Eosinophils Relative: 1.2 %
HCT: 45.5 % (ref 38.5–50.0)
Hemoglobin: 15.4 g/dL (ref 13.2–17.1)
Lymphs Abs: 2050 cells/uL (ref 850–3900)
MCH: 31.2 pg (ref 27.0–33.0)
MCHC: 33.8 g/dL (ref 32.0–36.0)
MCV: 92.1 fL (ref 80.0–100.0)
MPV: 10.5 fL (ref 7.5–12.5)
Monocytes Relative: 7.6 %
Neutro Abs: 5470 cells/uL (ref 1500–7800)
Neutrophils Relative %: 65.9 %
Platelets: 249 10*3/uL (ref 140–400)
RBC: 4.94 10*6/uL (ref 4.20–5.80)
RDW: 13.1 % (ref 11.0–15.0)
Total Lymphocyte: 24.7 %
WBC: 8.3 10*3/uL (ref 3.8–10.8)

## 2019-09-26 LAB — COMPLETE METABOLIC PANEL WITH GFR
AG Ratio: 1.8 (calc) (ref 1.0–2.5)
ALT: 10 U/L (ref 9–46)
AST: 14 U/L (ref 10–35)
Albumin: 4.3 g/dL (ref 3.6–5.1)
Alkaline phosphatase (APISO): 46 U/L (ref 35–144)
BUN: 12 mg/dL (ref 7–25)
CO2: 29 mmol/L (ref 20–32)
Calcium: 9.5 mg/dL (ref 8.6–10.3)
Chloride: 105 mmol/L (ref 98–110)
Creat: 0.93 mg/dL (ref 0.70–1.25)
GFR, Est African American: 97 mL/min/{1.73_m2} (ref >=60)
GFR, Est Non African American: 83 mL/min/{1.73_m2} (ref >=60)
Globulin: 2.4 g/dL (calc) (ref 1.9–3.7)
Glucose, Bld: 80 mg/dL (ref 65–99)
Potassium: 4 mmol/L (ref 3.5–5.3)
Sodium: 141 mmol/L (ref 135–146)
Total Bilirubin: 0.6 mg/dL (ref 0.2–1.2)
Total Protein: 6.7 g/dL (ref 6.1–8.1)

## 2019-09-26 LAB — URINALYSIS, ROUTINE W REFLEX MICROSCOPIC
Bilirubin Urine: NEGATIVE
Glucose, UA: NEGATIVE
Hgb urine dipstick: NEGATIVE
Ketones, ur: NEGATIVE
Leukocytes,Ua: NEGATIVE
Nitrite: NEGATIVE
Protein, ur: NEGATIVE
Specific Gravity, Urine: 1.02 (ref 1.001–1.03)
pH: <=5 (ref 5.0–8.0)

## 2019-09-26 LAB — PSA: PSA: 2.99 ng/mL (ref <=4.0)

## 2019-09-26 LAB — VITAMIN D 25 HYDROXY (VIT D DEFICIENCY, FRACTURES): Vit D, 25-Hydroxy: 81 ng/mL (ref 30–100)

## 2019-09-26 LAB — LIPID PANEL
Cholesterol: 235 mg/dL — ABNORMAL HIGH (ref <200)
HDL: 44 mg/dL (ref >=40)
LDL Cholesterol (Calc): 162 mg/dL (calc) — ABNORMAL HIGH
Non-HDL Cholesterol (Calc): 191 mg/dL (calc) — ABNORMAL HIGH (ref <130)
Total CHOL/HDL Ratio: 5.3 (calc) — ABNORMAL HIGH (ref <5.0)
Triglycerides: 147 mg/dL (ref <150)

## 2019-09-26 LAB — MAGNESIUM: Magnesium: 2.3 mg/dL (ref 1.5–2.5)

## 2019-09-26 LAB — MICROALBUMIN / CREATININE URINE RATIO
Creatinine, Urine: 151 mg/dL (ref 20–320)
Microalb Creat Ratio: 3 mcg/mg creat (ref <30)
Microalb, Ur: 0.4 mg/dL

## 2019-09-26 LAB — INSULIN, RANDOM: Insulin: 5.1 u[IU]/mL

## 2019-09-26 LAB — HEMOGLOBIN A1C
Hgb A1c MFr Bld: 5.6 % of total Hgb (ref <5.7)
Mean Plasma Glucose: 114 (calc)
eAG (mmol/L): 6.3 (calc)

## 2019-09-26 LAB — TSH: TSH: 1.52 mIU/L (ref 0.40–4.50)

## 2019-09-26 NOTE — Progress Notes (Signed)
==========================================================  -  PSA -  low  & OK ==========================================================  -  Total Chol - 235  -Elevated  (Ideal or goal is less than180)                                                                                                                                                                                                                                                                              - LDL Chol = 162 - way too elevated  (Ideal or goal is less than 70 !)   - Strongly recommend start treatment to lower cholesterol if  you are willing to take meds  Otherwise Diet is also still very important   - Cholesterol only comes from animal sources  - ie. meat, dairy, egg yolks  - Eat all the vegetables you want.  - Avoid meat, especially red meat - Beef AND Pork .  - Avoid cheese & dairy - milk & ice cream.     - Cheese is the most concentrated form of trans-fats which  is the worst thing to clog up our arteries.   - Veggie cheese is OK which can be found in the fresh  produce section at [employer] or [employer] or [employer] ==========================================================  -  Vitamin D = 81 - Excellent  ==========================================================  -  All Else - CBC - Kidneys - Electrolytes - Liver - Magnesium & Thyroid    - all  Normal / OK ==========================================================           - Recommend low cholesterol diet   - Cholesterol only comes from animal sources  - ie. meat, dairy, egg yolks  - Eat all the vegetables you want.  - Avoid meat, especially red meat - Beef AND Pork .  - Avoid cheese & dairy - milk & ice cream.     - Cheese is the most concentrated form of trans-fats which  is the worst thing to clog up our arteries.   - Veggie cheese is OK which can be found in the fresh  produce section at [employer] or [employer] or [employer] ==========================================================  -  LDL= 162 - way too high -  ==========================================================

## 2019-12-25 ENCOUNTER — Ambulatory Visit: Payer: Medicare Other | Admitting: Adult Health Nurse Practitioner

## 2020-03-27 ENCOUNTER — Encounter: Payer: Self-pay | Admitting: Adult Health

## 2020-03-27 NOTE — Progress Notes (Signed)
MEDICARE ANNUAL WELLNESS VISIT AND FOLLOW UP  Assessment:   Diagnoses and all orders for this visit:  Medicare annual wellness visit, subsequent Yearly wellness visit  Labile hypertension No medications, controlled today DASH diet,  exercise and monitor at home  Call if greater than 140/90.   Hyperlipidemia, mixed Discussed dietary modifications;  Long discussion; declines medication for this at this time ? Familial component, doesn't improve with lifestyle per patient; no family hx of MI/CVA Discussed CT coronary calcium; will consider; information given; declines today  Discussed benefits and risks  Abnormal glucose Discussed dietary and exercise modifications Just started lectin free diet  Vitamin D deficiency Continue supplementation  TMJ, temporomandibular joint syndrome  Resolved; monitor   Over 40 minutes of exam, counseling, chart review and critical decision making was performed Future Appointments  Date Time Provider Department Center  09/24/2020  2:00 PM Lucky Cowboy, MD [facility] None  03/31/2021 11:30 AM Judd Gaudier, NP [facility] None     Plan:   During the course of the visit the patient was educated and counseled about appropriate screening and preventive services including:    Pneumococcal vaccine   Prevnar 13  Influenza vaccine  Td vaccine  Screening electrocardiogram  Bone densitometry screening  Colorectal cancer screening  Diabetes screening  Glaucoma screening  Nutrition counseling   Advanced directives: requested   Subjective:  Kenneth Cantu is a 70 y.o. male who presents for Medicare Annual Wellness. He has Hyperlipidemia, mixed; Labile hypertension; Vitamin D deficiency; Obesity (BMI 30.0-34.9); Abnormal glucose (hx of prediabetes) ; and TMJ (temporomandibular joint syndrome) on their problem list.  Reports he is doing well since last visit.    Was having TMJ , did follow up with dentist, reports sx have  resolved.   BMI is Body mass index is 33.3 kg/m., he has been working on diet and exercise, active on [place], steady weight since young adult. He does plan to get <210 lb.  Wt Readings from Last 3 Encounters:  03/28/20 219 lb (99.3 kg)  09/25/19 219 lb (99.3 kg)  09/06/18 209 lb 9.6 oz (95.1 kg)    His blood pressure has been controlled at home, today their BP is BP: 130/74 He does workout by means of walking his dogs.  He denies chest pain, shortness of breath, dizziness.   He is not on cholesterol medication and denies myalgias. His cholesterol is not at goal. (LDL persistently 150+ with question of familial component). No family hx of MI/CVA. Discussed CT coronary calcium score, would consider but declines today. The cholesterol last visit was:   Lab Results  Component Value Date   CHOL 235 (H) 09/25/2019   HDL 44 09/25/2019   LDLCALC 162 (H) 09/25/2019   TRIG 147 09/25/2019   CHOLHDL 5.3 (H) 09/25/2019   He has not been working on diet and exercise for hx of prediabetes, and denies nausea, paresthesia of the feet, polydipsia, polyuria and visual disturbances. Last A1C in the office was:  Lab Results  Component Value Date   HGBA1C 5.6 09/25/2019   Last GFR: Lab Results  Component Value Date   GFRNONAA 83 09/25/2019   Patient is on Vitamin D supplement.   Lab Results  Component Value Date   VD25OH 81 09/25/2019      Medication Review: Current Outpatient Medications on File Prior to Visit  Medication Sig Dispense Refill  . Ascorbic Acid (VITAMIN C) 1000 MG tablet Take 1,000 mg by mouth daily.    . ASPIRIN  81 PO Take by mouth daily.    . cholecalciferol (VITAMIN D) 1000 units tablet Take 1,000 Units by mouth 2 (two) times daily.    . Multiple Vitamins-Minerals (MULTIVITAMIN & MINERAL PO) Take by mouth daily.    . TURMERIC PO Take by mouth daily.    . Zinc 50 MG TABS Take by mouth.     No current facility-administered medications on file prior to visit.    No  Known Allergies  Current Problems (verified) Patient Active Problem List   Diagnosis Date Noted  . Abnormal glucose (hx of prediabetes)  12/10/2017  . TMJ (temporomandibular joint syndrome) 12/10/2017  . Obesity (BMI 30.0-34.9) 06/08/2013  . Hyperlipidemia, mixed   . Labile hypertension   . Vitamin D deficiency     Screening Tests Immunization History  Administered Date(s) Administered  . DT (Pediatric) 06/11/2014  . Moderna Sars-Covid-2 Vaccination 03/22/2019, 04/26/2019  . PPD Test 06/08/2013, 06/11/2014    Preventative care: Last colon cancer screening: Cologuard 10/2018 neg  Prior vaccinations: TD or Tdap: 2016  Influenza: Declined Pneumococcal: Declined Prevnar13: Declined Shingles/Zostavax:Declined Covid 19: 2/3, moderna, 2021, declining booster  Names of Other Physician/Practitioners you currently use: 1. [facility] here for primary care 2. Eye Exam, Dr. Alexia Freestone, last 2022, contacts 3. Dentist, Dr. Salem Senate (periodontist), last 2022, alternates, goes q25m   Patient Care Team: Lucky Cowboy, MD as PCP - General (Internal Medicine) Louis Meckel, MD (Inactive) as Consulting Physician (Gastroenterology)  SURGICAL HISTORY He  has a past surgical history that includes Appendectomy (1981); Refractive surgery (Bilateral, 2011); Wrist fracture surgery (Left, 1970's); and ORIF clavicle fracture (Left, 1970s). FAMILY HISTORY His family history includes Benign prostatic hyperplasia in his father; Cancer in an other family member; Heart disease in his brother; Hypertension in his brother and father; Seizures in an other family member; Thyroid disease in his mother. SOCIAL HISTORY He  reports that he quit smoking about 36 years ago. He has never used smokeless tobacco. He reports that he does not drink alcohol and does not use drugs.   MEDICARE WELLNESS OBJECTIVES: Physical activity: Current Exercise Habits: Home exercise  routine, Type of exercise: walking, Time (Minutes): 20, Frequency (Times/Week): 7, Weekly Exercise (Minutes/Week): 140, Intensity: Mild, Exercise limited by: None identified Cardiac risk factors: Cardiac Risk Factors include: advanced age (>63men, >12 women);male gender;hypertension;dyslipidemia;obesity (BMI >30kg/m2);smoking/ tobacco exposure Depression/mood screen:   Depression screen [facility] 2/9 03/28/2020  Decreased Interest 0  Down, Depressed, Hopeless 0  PHQ - 2 Score 0  Altered sleeping -  Tired, decreased energy -  Change in appetite -  Feeling bad or failure about yourself  -  Trouble concentrating -  Moving slowly or fidgety/restless -  Suicidal thoughts -  PHQ-9 Score -  Difficult doing work/chores -    ADLs:  In your present state of health, do you have any difficulty performing the following activities: 03/28/2020 09/24/2019  Hearing? N N  Vision? N N  Difficulty concentrating or making decisions? N N  Walking or climbing stairs? N N  Dressing or bathing? N N  Doing errands, shopping? N N  Some recent data might be hidden     Cognitive Testing  Alert? Yes  Normal Appearance?Yes  Oriented to person? Yes  Place? Yes   Time? Yes  Recall of three objects?  Yes  Can perform simple calculations? Yes  Displays appropriate judgment?Yes  Can read the correct time from a watch face?Yes  EOL planning: Does Patient Have a Medical  Advance Directive?: No Does patient want to make changes to medical advance directive?: No - Patient declined Would patient like information on creating a medical advance directive?: No - Patient declined  Review of Systems  Constitutional: Negative for malaise/fatigue and weight loss.  HENT: Negative for hearing loss and tinnitus.   Eyes: Negative for blurred vision and double vision.  Respiratory: Negative for cough, sputum production, shortness of breath and wheezing.   Cardiovascular: Negative for chest pain, palpitations, orthopnea, claudication,  leg swelling and PND.  Gastrointestinal: Negative for abdominal pain, blood in stool, constipation, diarrhea, heartburn, melena, nausea and vomiting.  Genitourinary: Negative.   Musculoskeletal: Negative for falls, joint pain and myalgias.  Skin: Negative for rash.  Neurological: Negative for dizziness, tingling, sensory change, weakness and headaches.  Endo/Heme/Allergies: Negative for polydipsia.  Psychiatric/Behavioral: Negative.  Negative for depression, memory loss, substance abuse and suicidal ideas. The patient is not nervous/anxious and does not have insomnia.   All other systems reviewed and are negative.    Objective:     Today's Vitals   03/28/20 1442  BP: 130/74  Pulse: 86  Temp: (!) 97.3 F (36.3 C)  SpO2: 96%  Weight: 219 lb (99.3 kg)   Body mass index is 33.3 kg/m.  General appearance: alert, no distress, WD/WN, male HEENT: normocephalic, sclerae anicteric, TMs pearly, nares patent, no discharge or erythema, pharynx normal Oral cavity: MMM, no lesions Neck: supple, no lymphadenopathy, no thyromegaly, no masses Heart: RRR, normal S1, S2, no murmurs Lungs: CTA bilaterally, no wheezes, rhonchi, or rales Abdomen: +bs, soft, non tender, non distended, no masses, no hepatomegaly, no splenomegaly Musculoskeletal: nontender, no swelling, no obvious deformity.left crepitus noted, no tenderness. Extremities: no edema, no cyanosis, no clubbing Pulses: 2+ symmetric, upper and lower extremities, normal cap refill Neurological: alert, oriented x 3, CN2-12 intact, strength normal upper extremities and lower extremities, sensation normal throughout, DTRs 2+ throughout, no cerebellar signs, gait normal Psychiatric: normal affect, behavior normal, pleasant   Medicare Attestation I have personally reviewed: The patient's medical and social history Their use of alcohol, tobacco or illicit drugs Their current medications and supplements The patient's functional ability including  ADLs,fall risks, home safety risks, cognitive, and hearing and visual impairment Diet and physical activities Evidence for depression or mood disorders  The patient's weight, height, BMI, and visual acuity have been recorded in the chart.  I have made referrals, counseling, and provided education to the patient based on review of the above and I have provided the patient with a written personalized care plan for preventive services.     Dan Maker, NP   03/28/2020

## 2020-03-28 ENCOUNTER — Encounter: Payer: Self-pay | Admitting: Adult Health

## 2020-03-28 ENCOUNTER — Other Ambulatory Visit: Payer: Self-pay

## 2020-03-28 ENCOUNTER — Ambulatory Visit (INDEPENDENT_AMBULATORY_CARE_PROVIDER_SITE_OTHER): Payer: Medicare Other | Admitting: Adult Health

## 2020-03-28 VITALS — BP 130/74 | HR 86 | Temp 97.3°F | Wt 219.0 lb

## 2020-03-28 DIAGNOSIS — M26609 Unspecified temporomandibular joint disorder, unspecified side: Secondary | ICD-10-CM

## 2020-03-28 DIAGNOSIS — E669 Obesity, unspecified: Secondary | ICD-10-CM | POA: Diagnosis not present

## 2020-03-28 DIAGNOSIS — E559 Vitamin D deficiency, unspecified: Secondary | ICD-10-CM

## 2020-03-28 DIAGNOSIS — R6889 Other general symptoms and signs: Secondary | ICD-10-CM

## 2020-03-28 DIAGNOSIS — Z0001 Encounter for general adult medical examination with abnormal findings: Secondary | ICD-10-CM

## 2020-03-28 DIAGNOSIS — R7309 Other abnormal glucose: Secondary | ICD-10-CM | POA: Diagnosis not present

## 2020-03-28 DIAGNOSIS — E782 Mixed hyperlipidemia: Secondary | ICD-10-CM | POA: Diagnosis not present

## 2020-03-28 DIAGNOSIS — R0989 Other specified symptoms and signs involving the circulatory and respiratory systems: Secondary | ICD-10-CM

## 2020-03-28 DIAGNOSIS — Z0000 Encounter for general adult medical examination without abnormal findings: Secondary | ICD-10-CM

## 2020-03-28 NOTE — Patient Instructions (Addendum)
Kenneth Cantu , Thank you for taking time to come for your Medicare Wellness Visit. I appreciate your ongoing commitment to your health goals. Please review the following plan we discussed and let me know if I can assist you in the future.   This is a list of the screening recommended for you and due dates:  Health Maintenance  Topic Date Due  . COVID-19 Vaccine (3 - Booster for Moderna series) 10/26/2019  . Cologuard (Stool DNA test)  10/18/2021  . Tetanus Vaccine  06/10/2024  .  Hepatitis C: One time screening is recommended by Center for Disease Control  (CDC) for  adults born from [birth year] through 1965.   Completed  . HPV Vaccine  Aged Out  . Flu Shot  Discontinued  . Pneumonia vaccines  Discontinued       Coronary Calcium Scan A coronary calcium scan is an imaging test used to look for deposits of plaque in the inner lining of the blood vessels of the heart (coronary arteries). Plaque is made up of calcium, protein, and fatty substances. These deposits of plaque can partly clog and narrow the coronary arteries without producing any symptoms or warning signs. This puts a person at risk for a heart attack. This test is recommended for people who are at moderate risk for heart disease. The test can find plaque deposits before symptoms develop. Tell a health care provider about:  Any allergies you have.  All medicines you are taking, including vitamins, herbs, eye drops, creams, and over-the-counter medicines.  Any problems you or family members have had with anesthetic medicines.  Any blood disorders you have.  Any surgeries you have had.  Any medical conditions you have.  Whether you are pregnant or may be pregnant. What are the risks? Generally, this is a safe procedure. However, problems may occur, including:  Harm to a pregnant woman and her unborn baby. This test involves the use of radiation. Radiation exposure can be dangerous to a pregnant woman and her unborn baby. If  you are pregnant or think you may be pregnant, you should not have this procedure done.  Slight increase in the risk of cancer. This is because of the radiation involved in the test. What happens before the procedure? Ask your health care provider for any specific instructions on how to prepare for this procedure. You may be asked to avoid products that contain caffeine, tobacco, or nicotine for 4 hours before the procedure. What happens during the procedure?  You will undress and remove any jewelry from your neck or chest.  You will put on a hospital gown.  Sticky electrodes will be placed on your chest. The electrodes will be connected to an electrocardiogram (ECG) machine to record a tracing of the electrical activity of your heart.  You will lie down on a curved bed that is attached to the CT scanner.  You may be given medicine to slow down your heart rate so that clear pictures can be created.  You will be moved into the CT scanner, and the CT scanner will take pictures of your heart. During this time, you will be asked to lie still and hold your breath for 2-3 seconds at a time while each picture of your heart is being taken. The procedure may vary among health care providers and hospitals.   What happens after the procedure?  You can get dressed.  You can return to your normal activities.  It is up to you to get the  results of your procedure. Ask your health care provider, or the department that is doing the procedure, when your results will be ready. Summary  A coronary calcium scan is an imaging test used to look for deposits of plaque in the inner lining of the blood vessels of the heart (coronary arteries). Plaque is made up of calcium, protein, and fatty substances.  Generally, this is a safe procedure. Tell your health care provider if you are pregnant or may be pregnant.  Ask your health care provider for any specific instructions on how to prepare for this  procedure.  A CT scanner will take pictures of your heart.  You can return to your normal activities after the scan is done. This information is not intended to replace advice given to you by your health care provider. Make sure you discuss any questions you have with your health care provider. Document Revised: 07/12/2018 Document Reviewed: 07/12/2018 Elsevier Patient Education  2021 [employer].

## 2020-03-29 LAB — COMPLETE METABOLIC PANEL WITH GFR
AG Ratio: 1.5 (calc) (ref 1.0–2.5)
ALT: 10 U/L (ref 9–46)
AST: 14 U/L (ref 10–35)
Albumin: 4 g/dL (ref 3.6–5.1)
Alkaline phosphatase (APISO): 46 U/L (ref 35–144)
BUN: 12 mg/dL (ref 7–25)
CO2: 26 mmol/L (ref 20–32)
Calcium: 9.2 mg/dL (ref 8.6–10.3)
Chloride: 104 mmol/L (ref 98–110)
Creat: 1.21 mg/dL (ref 0.70–1.25)
GFR, Est African American: 70 mL/min/{1.73_m2} (ref >=60)
GFR, Est Non African American: 61 mL/min/{1.73_m2} (ref >=60)
Globulin: 2.6 g/dL (calc) (ref 1.9–3.7)
Glucose, Bld: 129 mg/dL (ref 65–139)
Potassium: 4 mmol/L (ref 3.5–5.3)
Sodium: 140 mmol/L (ref 135–146)
Total Bilirubin: 0.4 mg/dL (ref 0.2–1.2)
Total Protein: 6.6 g/dL (ref 6.1–8.1)

## 2020-03-29 LAB — CBC WITH DIFFERENTIAL/PLATELET
Absolute Monocytes: 623 cells/uL (ref 200–950)
Basophils Absolute: 33 cells/uL (ref 0–200)
Basophils Relative: 0.4 %
Eosinophils Absolute: 58 cells/uL (ref 15–500)
Eosinophils Relative: 0.7 %
HCT: 44.7 % (ref 38.5–50.0)
Hemoglobin: 14.9 g/dL (ref 13.2–17.1)
Lymphs Abs: 1693 cells/uL (ref 850–3900)
MCH: 30.5 pg (ref 27.0–33.0)
MCHC: 33.3 g/dL (ref 32.0–36.0)
MCV: 91.6 fL (ref 80.0–100.0)
MPV: 11.1 fL (ref 7.5–12.5)
Monocytes Relative: 7.5 %
Neutro Abs: 5893 cells/uL (ref 1500–7800)
Neutrophils Relative %: 71 %
Platelets: 251 10*3/uL (ref 140–400)
RBC: 4.88 10*6/uL (ref 4.20–5.80)
RDW: 13 % (ref 11.0–15.0)
Total Lymphocyte: 20.4 %
WBC: 8.3 10*3/uL (ref 3.8–10.8)

## 2020-03-29 LAB — LIPID PANEL
Cholesterol: 220 mg/dL — ABNORMAL HIGH (ref <200)
HDL: 39 mg/dL — ABNORMAL LOW (ref >=40)
LDL Cholesterol (Calc): 148 mg/dL (calc) — ABNORMAL HIGH
Non-HDL Cholesterol (Calc): 181 mg/dL (calc) — ABNORMAL HIGH (ref <130)
Total CHOL/HDL Ratio: 5.6 (calc) — ABNORMAL HIGH (ref <5.0)
Triglycerides: 189 mg/dL — ABNORMAL HIGH (ref <150)

## 2020-03-29 LAB — HEMOGLOBIN A1C
Hgb A1c MFr Bld: 5.6 % of total Hgb (ref <5.7)
Mean Plasma Glucose: 114 mg/dL
eAG (mmol/L): 6.3 mmol/L

## 2020-03-29 LAB — TSH: TSH: 1.34 mIU/L (ref 0.40–4.50)

## 2020-09-24 ENCOUNTER — Encounter: Payer: Medicare Other | Admitting: Internal Medicine

## 2020-09-25 ENCOUNTER — Telehealth: Payer: Self-pay

## 2020-09-25 NOTE — Telephone Encounter (Signed)
The patient has refused to complete his Cologuard and the previous order is now expired with [facility].

## 2021-03-27 NOTE — Progress Notes (Deleted)
?MEDICARE ANNUAL WELLNESS VISIT AND FOLLOW UP ? ?Assessment:  ? ?Diagnoses and all orders for this visit: ? ?Medicare annual wellness visit, subsequent ?Yearly wellness visit ? ?Labile hypertension ?No medications, controlled today ?DASH diet,  ?exercise and monitor at home  ?Call if greater than 140/90.  ? ?Hyperlipidemia, mixed ?Discussed dietary modifications;  ?Long discussion; declines medication for this at this time ?? Familial component, doesn't improve with lifestyle per patient; no family hx of MI/CVA ?Discussed CT coronary calcium; will consider; information given; declines today  ?Discussed benefits and risks ?- Lipid, CBC, CMP , TSH ? ?Abnormal glucose ?Discussed dietary and exercise modifications ?Just started lectin free diet ?- A1c ? ?Vitamin D deficiency ?Continue supplementation ? ?TMJ, temporomandibular joint syndrome  ?Resolved; monitor  ? ?BPH ?Monitor symptoms ?- PSA ? ?Medication Management ?- Magnesium ? ?Screening for hematuria/proteinuria ?- Routine UA with reflex microscopic ?- Microalbumin/ creatinine ratio ? ?Screening for thyroid disorder ?- TSH ? ?Screening for ischemic heart disease ?- EKG ? ?Over 40 minutes of exam, counseling, chart review and critical decision making was performed ?Future Appointments  ?Date Time Provider [place]  ?03/31/2021 11:30 AM Theon Sobotka, Townsend Roger, NP [id number] None  ? ? ? ?Plan:  ? ?During the course of the visit the patient was educated and counseled about appropriate screening and preventive services including:  ? ?Pneumococcal vaccine  ?Prevnar 13 ?Influenza vaccine ?Td vaccine ?Screening electrocardiogram ?Bone densitometry screening ?Colorectal cancer screening ?Diabetes screening ?Glaucoma screening ?Nutrition counseling  ?Advanced directives: requested ? ? ?Subjective:  ?Kenneth Cantu is a 71 y.o. male who presents for Medicare Annual Wellness. He has Hyperlipidemia, mixed; Labile hypertension; Vitamin D deficiency; Obesity (BMI 30.0-34.9);  Abnormal glucose (hx of prediabetes) ; and TMJ (temporomandibular joint syndrome) on their problem list. ? ?Reports he is doing well since last visit.   ? ?Was having TMJ , did follow up with dentist, reports sx have resolved.  ? ?BMI is There is no height or weight on file to calculate BMI., he has been working on diet and exercise, active [place], steady weight since young adult. He does plan to get <210 lb.  ?Wt Readings from Last 3 Encounters:  ?03/28/20 219 lb (99.3 kg)  ?09/25/19 219 lb (99.3 kg)  ?09/06/18 209 lb 9.6 oz (95.1 kg)  ? ? His blood pressure has been controlled at home, today their BP is   ?He does workout by means of walking his dogs.  He denies chest pain, shortness of breath, dizziness.  ? ?He is not on cholesterol medication and denies myalgias. His cholesterol is not at goal. (LDL persistently 150+ with question of familial component). No family hx of MI/CVA. Discussed CT coronary calcium score, would consider but declines today. The cholesterol last visit was:   ?Lab Results  ?Component Value Date  ? CHOL 220 (H) 03/28/2020  ? HDL 39 (L) 03/28/2020  ? LDLCALC 148 (H) 03/28/2020  ? TRIG 189 (H) 03/28/2020  ? CHOLHDL 5.6 (H) 03/28/2020  ? ?He has not been working on diet and exercise for hx of prediabetes, and denies nausea, paresthesia of the feet, polydipsia, polyuria and visual disturbances. Last A1C in the office was:  ?Lab Results  ?Component Value Date  ? HGBA1C 5.6 03/28/2020  ? ?Last GFR: ?Lab Results  ?Component Value Date  ? GFRNONAA 61 03/28/2020  ? ?Patient is on Vitamin D supplement.   ?Lab Results  ?Component Value Date  ? VD25OH 81 09/25/2019  ?   ? ?  Medication Review: ?Current Outpatient Medications on File Prior to Visit  ?Medication Sig Dispense Refill  ? Ascorbic Acid (VITAMIN C) 1000 MG tablet Take 1,000 mg by mouth daily.    ? ASPIRIN 81 PO Take by mouth daily.    ? cholecalciferol (VITAMIN D) 1000 units tablet Take 1,000 Units by mouth 2 (two) times daily.    ?  Multiple Vitamins-Minerals (MULTIVITAMIN & MINERAL PO) Take by mouth daily.    ? TURMERIC PO Take by mouth daily.    ? Zinc 50 MG TABS Take by mouth.    ? ?No current facility-administered medications on file prior to visit.  ? ? ?No Known Allergies ? ?Current Problems (verified) ?Patient Active Problem List  ? Diagnosis Date Noted  ? Abnormal glucose (hx of prediabetes)  12/10/2017  ? TMJ (temporomandibular joint syndrome) 12/10/2017  ? Obesity (BMI 30.0-34.9) 06/08/2013  ? Hyperlipidemia, mixed   ? Labile hypertension   ? Vitamin D deficiency   ? ? ?Screening Tests ?Immunization History  ?Administered Date(s) Administered  ? DT (Pediatric) 06/11/2014  ? Moderna Sars-Covid-2 Vaccination 03/22/2019, 04/26/2019  ? PPD Test 06/08/2013, 06/11/2014  ? ? ?Preventative care: ?Last colon cancer screening: Cologuard 10/2018 neg ? ?Prior vaccinations: ?TD or Tdap: 2016  ?Influenza: Declined ?Pneumococcal: Declined ?Prevnar13: Declined ?Shingles/Zostavax:Declined ?Covid 19: 2/3, moderna, 2021, declining booster ? ?Names of Other Physician/Practitioners you currently use: ?1. [facility] here for primary care ?2. Eye Exam, Dr. Chong Sicilian, last 2022, contacts ?3. Dentist, Dr. Marquis Lunch (periodontist), last 2022, alternates, goes q29m  ? ?Patient Care Team: ?Unk Pinto, MD as PCP - General (Internal Medicine) ?Inda Castle, MD (Inactive) as Consulting Physician (Gastroenterology) ? ?SURGICAL HISTORY ?He  has a past surgical history that includes Appendectomy (1981); Refractive surgery (Bilateral, 2011); Wrist fracture surgery (Left, 1970's); and ORIF clavicle fracture (Left, 1970s). ?FAMILY HISTORY ?His family history includes Benign prostatic hyperplasia in his father; Cancer in an other family member; Heart disease in his brother; Hypertension in his brother and father; Seizures in an other family member; Thyroid disease in his mother. ?SOCIAL HISTORY ?He  reports that he quit  smoking about 37 years ago. He has never used smokeless tobacco. He reports that he does not drink alcohol and does not use drugs. ? ? ?MEDICARE WELLNESS OBJECTIVES: ?Physical activity:   ?Cardiac risk factors:   ?Depression/mood screen:   ? ?  03/28/2020  ?  3:03 PM  ?Depression screen PHQ 2/9  ?Decreased Interest 0  ?Down, Depressed, Hopeless 0  ?PHQ - 2 Score 0  ?  ?ADLs:  ? ?  03/28/2020  ?  2:55 PM  ?In your present state of health, do you have any difficulty performing the following activities:  ?Hearing? 0  ?Vision? 0  ?Difficulty concentrating or making decisions? 0  ?Walking or climbing stairs? 0  ?Dressing or bathing? 0  ?Doing errands, shopping? 0  ?  ? ?Cognitive Testing ? Alert? Yes  Normal Appearance?Yes ? Oriented to person? Yes  Place? Yes ?  Time? Yes ? Recall of three objects?  Yes ? Can perform simple calculations? Yes ? Displays appropriate judgment?Yes ? Can read the correct time from a watch face?Yes ? ?EOL planning:   ? ?Review of Systems  ?Constitutional:  Negative for malaise/fatigue and weight loss.  ?HENT:  Negative for hearing loss and tinnitus.   ?Eyes:  Negative for blurred vision and double vision.  ?Respiratory:  Negative for cough, sputum production, shortness of breath and wheezing.   ?  Cardiovascular:  Negative for chest pain, palpitations, orthopnea, claudication, leg swelling and PND.  ?Gastrointestinal:  Negative for abdominal pain, blood in stool, constipation, diarrhea, heartburn, melena, nausea and vomiting.  ?Genitourinary: Negative.   ?Musculoskeletal:  Negative for falls, joint pain and myalgias.  ?Skin:  Negative for rash.  ?Neurological:  Negative for dizziness, tingling, sensory change, weakness and headaches.  ?Endo/Heme/Allergies:  Negative for polydipsia.  ?Psychiatric/Behavioral: Negative.  Negative for depression, memory loss, substance abuse and suicidal ideas. The patient is not nervous/anxious and does not have insomnia.   ?All other systems reviewed and are  negative. ? ? ?Objective:  ?   ?There were no vitals filed for this visit. ? ?There is no height or weight on file to calculate BMI. ? ?General appearance: alert, no distress, WD/WN, male ?HEENT: normocephalic, scle

## 2021-03-31 ENCOUNTER — Ambulatory Visit: Payer: Medicare Other | Admitting: Nurse Practitioner

## 2021-06-19 NOTE — Progress Notes (Signed)
Annual  Screening/Preventative Visit  & Comprehensive Evaluation & Examination  (Patient has  Refused Recommended  Medicare Wellness Visits in the past)     This very nice 71 y.o.  [name] presents for a Screening / Preventative Visit & comprehensive evaluation and management of multiple medical co-morbidities.  Patient has been followed for labile HTN, HLD, Prediabetes and Vitamin D Deficiency.      Patient has been followed for labile HTN since  1995. Patient's BP has been controlled at home.  Today's BP at goal -  130/72.  Patient denies any cardiac symptoms as chest pain, palpitations, shortness of breath, dizziness or ankle swelling.      Patient's hyperlipidemia is not controlled with diet as patient is reticent to take meds for Cholesterol. Last lipids were not at goal:  Lab Results  Component Value Date   CHOL 220 (H) 03/28/2020   HDL 39 (L) 03/28/2020   LDLCALC 148 (H) 03/28/2020   TRIG 189 (H) 03/28/2020   CHOLHDL 5.6 (H) 03/28/2020        Patient has moderate Obesity (BMI 31+) and consequent prediabetes  (A1c 5.8% 2012)  and patient denies reactive hypoglycemic symptoms, visual blurring, diabetic polys or paresthesias. Last A1c was  Lab Results  Component Value Date   HGBA1C 5.6 03/28/2020        Patient has history of Vitamin D Deficiency ("43" /2011) and last vitamin D was at goal: Lab Results  Component Value Date   VD25OH 81 09/25/2019       Current Outpatient Medications on File Prior to Visit  Medication Sig   ASPIRIN 81 mg Take  daily.   VITAMIN D 2,000 units  Take 2,000 Units  2 x/day times daily. (=4,000 units/day)   MultiVit w/Minerals  Take  daily.   TURMERIC  Take  daily.    Past Medical History:  Diagnosis Date   Labile hypertension    Mixed hyperlipidemia    Vitamin D deficiency    Health Maintenance  Topic Date Due   COVID-19 Vaccine (1) Never done   Fecal DNA (Cologuard)  10/18/2021   TETANUS/TDAP  06/10/2024   Hepatitis C Screening   Completed   INFLUENZA VACCINE  Discontinued   PNA vac Low Risk Adult  Discontinued   Immunization History  Administered Date(s) Administered   DT (Pediatric) 06/11/2014   PPD Test 06/08/2013, 06/11/2014    - Cologard Negative - 09/23/2015- and 3 yr f/u due Sept 2020, but patient never returned Cologard kit   Past Surgical History:  Procedure Laterality Date   APPENDECTOMY  1981   ORIF CLAVICLE FRACTURE Left 1970s   REFRACTIVE SURGERY Bilateral 2011   WRIST FRACTURE SURGERY Left 18's   Family History  Problem Relation Age of Onset   Thyroid disease Mother    Hypertension Father    Benign prostatic hyperplasia Father    Hypertension Brother    Heart disease Brother    Cancer Other        lung   Seizures Other    Social History   Socioeconomic History   Marital status: Single    Spouse name: Not on file   Number of children: Not on file  Occupational History   Retired EMT  Tobacco Use   Smoking status: Former Smoker    Quit date: 09/15/1983    Years since quitting: 36.0   Smokeless tobacco: Never Used  Substance and Sexual Activity   Alcohol use: No   Drug use: No  Sexual activity: Not on file    ROS Constitutional: Denies fever, chills, weight loss/gain, headaches, insomnia,  night sweats or change in appetite. Does c/o fatigue. Eyes: Denies redness, blurred vision, diplopia, discharge, itchy or watery eyes.  ENT: Denies discharge, congestion, post nasal drip, epistaxis, sore throat, earache, hearing loss, dental pain, Tinnitus, Vertigo, Sinus pain or snoring.  Cardio: Denies chest pain, palpitations, irregular heartbeat, syncope, dyspnea, diaphoresis, orthopnea, PND, claudication or edema Respiratory: denies cough, dyspnea, DOE, pleurisy, hoarseness, laryngitis or wheezing.  Gastrointestinal: Denies dysphagia, heartburn, reflux, water brash, pain, cramps, nausea, vomiting, bloating, diarrhea, constipation, hematemesis, melena, hematochezia, jaundice or  hemorrhoids Genitourinary: Denies dysuria, frequency, discharge, hematuria or flank pain. Has urgency, nocturia x 2-3 & occasional hesitancy. Musculoskeletal: Denies arthralgia, myalgia, stiffness, Jt. Swelling, pain, limp or strain/sprain. Denies Falls. Skin: Denies puritis, rash, hives, warts, acne, eczema or change in skin lesion Neuro: No weakness, tremor, incoordination, spasms, paresthesia or pain Psychiatric: Denies confusion, memory loss or sensory loss. Denies Depression. Endocrine: Denies change in weight, skin, hair change, nocturia, and paresthesia, diabetic polys, visual blurring or hyper / hypo glycemic episodes.  Heme/Lymph: No excessive bleeding, bruising or enlarged lymph nodes.  Physical Exam  There were no vitals taken for this visit.  General Appearance: Well nourished and well groomed and in no apparent distress.  Eyes: PERRLA, EOMs, conjunctiva no swelling or erythema, normal fundi and vessels. Sinuses: No frontal/maxillary tenderness ENT/Mouth: EACs patent / TMs  nl. Nares clear without erythema, swelling, mucoid exudates. Oral hygiene is good. No erythema, swelling, or exudate. Tongue normal, non-obstructing. Tonsils not swollen or erythematous. Hearing normal.  Neck: Supple, thyroid not palpable. No bruits, nodes or JVD. Respiratory: Respiratory effort normal.  BS equal and clear bilateral without rales, rhonci, wheezing or stridor. Cardio: Heart sounds are normal with regular rate and rhythm and no murmurs, rubs or gallops. Peripheral pulses are normal and equal bilaterally without edema. No aortic or femoral bruits. Chest: symmetric with normal excursions and percussion.  Abdomen: Soft, with Nl bowel sounds. Nontender, no guarding, rebound, hernias, masses, or organomegaly.  Lymphatics: Non tender without lymphadenopathy.  Musculoskeletal: Full ROM all peripheral extremities, joint stability, 5/5 strength, and normal gait. Skin: Warm and dry without rashes,  lesions, cyanosis, clubbing or  ecchymosis.  Neuro: Cranial nerves intact, reflexes equal bilaterally. Normal muscle tone, no cerebellar symptoms. Sensation intact.  Pysch: Alert and oriented X 3 with normal affect, insight and judgment appropriate.   Assessment and Plan  1. Annual Preventative/Screening Exam    2. Labile hypertension  - EKG 12-Lead - US, RETROPERITNL ABD,  LTD - Urinalysis, Routine w reflex microscopic - Microalbumin / creatinine urine ratio - CBC with Differential/Platelet - COMPLETE METABOLIC PANEL WITH GFR - Magnesium - TSH  3. Hyperlipidemia, mixed  - EKG 12-Lead - US, RETROPERITNL ABD,  LTD - Lipid panel - TSH  4. Abnormal glucose  - EKG 12-Lead - US, RETROPERITNL ABD,  LTD - Hemoglobin A1c - Insulin, random  5. Vitamin D deficiency  - VITAMIN D 25 Hydroxy  6. BPH with obstruction/lower urinary tract symptoms  - PSA  7. Screening for colorectal cancer  - Cologuard  8. Prostate cancer screening  - PSA  9. Screening for ischemic heart disease  - EKG 12-Lead  10. FHx: heart disease  - EKG 12-Lead - US, RETROPERITNL ABD,  LTD  11. Former smoker  - EKG 12-Lead - US, RETROPERITNL ABD,  LTD  12. Screening for AAA   - US, RETROPERITNL ABD,    LTD  13. Medication management  - Urinalysis, Routine w reflex microscopic - Microalbumin / creatinine urine ratio - CBC with Differential/Platelet - COMPLETE METABOLIC PANEL WITH GFR - Magnesium - Lipid panel - TSH - Hemoglobin A1c - Insulin, random - VITAMIN D 25 Hydroxy          Patient was counseled in prudent diet, weight control to achieve/maintain BMI less than 25, BP monitoring, regular exercise and medications as discussed.  Discussed med effects and SE's. Routine screening labs and tests as requested with regular follow-up as recommended. Over 40 minutes of exam, counseling, chart review and high complex critical decision making was performed   Kirtland Bouchard, MD

## 2021-06-20 ENCOUNTER — Encounter: Payer: Self-pay | Admitting: Internal Medicine

## 2021-06-20 ENCOUNTER — Ambulatory Visit (INDEPENDENT_AMBULATORY_CARE_PROVIDER_SITE_OTHER): Payer: Medicare Other | Admitting: Internal Medicine

## 2021-06-20 VITALS — BP 130/72 | HR 80 | Temp 98.0°F | Resp 16 | Ht 68.0 in | Wt 210.4 lb

## 2021-06-20 DIAGNOSIS — Z87891 Personal history of nicotine dependence: Secondary | ICD-10-CM

## 2021-06-20 DIAGNOSIS — N138 Other obstructive and reflux uropathy: Secondary | ICD-10-CM

## 2021-06-20 DIAGNOSIS — Z136 Encounter for screening for cardiovascular disorders: Secondary | ICD-10-CM | POA: Diagnosis not present

## 2021-06-20 DIAGNOSIS — Z0001 Encounter for general adult medical examination with abnormal findings: Secondary | ICD-10-CM

## 2021-06-20 DIAGNOSIS — R0989 Other specified symptoms and signs involving the circulatory and respiratory systems: Secondary | ICD-10-CM

## 2021-06-20 DIAGNOSIS — Z8249 Family history of ischemic heart disease and other diseases of the circulatory system: Secondary | ICD-10-CM

## 2021-06-20 DIAGNOSIS — R7309 Other abnormal glucose: Secondary | ICD-10-CM

## 2021-06-20 DIAGNOSIS — Z125 Encounter for screening for malignant neoplasm of prostate: Secondary | ICD-10-CM

## 2021-06-20 DIAGNOSIS — E782 Mixed hyperlipidemia: Secondary | ICD-10-CM

## 2021-06-20 DIAGNOSIS — I700 Atherosclerosis of aorta: Secondary | ICD-10-CM | POA: Diagnosis not present

## 2021-06-20 DIAGNOSIS — Z79899 Other long term (current) drug therapy: Secondary | ICD-10-CM

## 2021-06-20 DIAGNOSIS — Z0000 Encounter for general adult medical examination without abnormal findings: Secondary | ICD-10-CM

## 2021-06-20 DIAGNOSIS — Z1211 Encounter for screening for malignant neoplasm of colon: Secondary | ICD-10-CM

## 2021-06-20 DIAGNOSIS — E559 Vitamin D deficiency, unspecified: Secondary | ICD-10-CM

## 2021-06-20 NOTE — Patient Instructions (Signed)

## 2021-06-21 NOTE — Progress Notes (Signed)
<><><><><><><><><><><><><><><><><><><><><><><><><><><><><><><><><> <><><><><><><><><><><><><><><><><><><><><><><><><><><><><><><><><> - Test results slightly outside the reference range are not unusual. If there is anything important, I will review this with you,  otherwise it is considered normal test values.  If you have further questions,  please do not hesitate to contact me at the office or via My Chart.  <><><><><><><><><><><><><><><><><><><><><><><><><><><><><><><><><> <><><><><><><><><><><><><><><><><><><><><><><><><><><><><><><><><>  -  Total  Chol =     244      - Elevated             (  Ideal  or  Goal is less than 180  !  )  & -  Bad / Dangerous LDL  Chol =   174    - also Elevated              (  Ideal  or  Goal is less than 70  !  )    - Both way too high & high Risk for  Heart Attack, Stroke or developing Vascular Dementia   - Recommend  much stricter low cholesterol diet   - Cholesterol only comes from animal sources  - ie. meat, dairy, egg yolks  - Eat all the vegetables you want.  - Avoid Meat,       Avoid Meat,           Avoid Meat -        Especially Red Meat - Beef AND Pork .  - Avoid cheese & dairy - milk & ice cream.     - Cheese is the most concentrated form of trans-fats which  is the worst thing to clog up our arteries.   - Veggie cheese is OK which can be found in the fresh  produce section at [employer] or [employer] or [employer]  <><><><><><><><><><><><><><><><><><><><><><><><><><><><><><><><><> <><><><><><><><><><><><><><><><><><><><><><><><><><><><><><><><><>  -  A1c - Normal  - No Diabetes   - Great  ! <><><><><><><><><><><><><><><><><><><><><><><><><><><><><><><><><>  -  Vitamin D = 51  - is a little low    - Vitamin D goal is between 70-100.   - Please INCREASE your Vitamin D to 5,000 units  /day       ((  This is still a relatively small dose for a man                                                 - Most men need 10,000 units  to                                                                   achieve blood levels  between 70-100  ))   - It is very important as a natural anti-inflammatory and helping the                             immune system protect against viral infections, like the Covid-19    helping hair, skin, and nails, as well as reducing stroke and  heart attack risk.   - It helps your bones and helps with mood.  - It also decreases numerous cancer risks,  so please  take it as directed.   - Low Vit D is associated with a 200-300% higher risk for CANCER   and 200-300% higher risk for HEART   ATTACK  &  STROKE.    - It is also associated with higher death rate at younger ages,   autoimmune diseases like Rheumatoid arthritis, Lupus,  Multiple Sclerosis.     - Also many other serious conditions, like depression, Alzheimer's  Dementia, infertility, muscle aches, fatigue, fibromyalgia   - just to name a few. <><><><><><><><><><><><><><><><><><><><><><><><><><><><><><><><><> <><><><><><><><><><><><><><><><><><><><><><><><><><><><><><><><><>

## 2021-06-23 LAB — MAGNESIUM: Magnesium: 2.1 mg/dL (ref 1.5–2.5)

## 2021-06-23 LAB — COMPLETE METABOLIC PANEL WITH GFR
AG Ratio: 1.7 (calc) (ref 1.0–2.5)
ALT: 10 U/L (ref 9–46)
AST: 13 U/L (ref 10–35)
Albumin: 4.2 g/dL (ref 3.6–5.1)
Alkaline phosphatase (APISO): 50 U/L (ref 35–144)
BUN: 16 mg/dL (ref 7–25)
CO2: 23 mmol/L (ref 20–32)
Calcium: 9.4 mg/dL (ref 8.6–10.3)
Chloride: 107 mmol/L (ref 98–110)
Creat: 1.03 mg/dL (ref 0.70–1.28)
Globulin: 2.5 g/dL (calc) (ref 1.9–3.7)
Glucose, Bld: 84 mg/dL (ref 65–99)
Potassium: 4.2 mmol/L (ref 3.5–5.3)
Sodium: 140 mmol/L (ref 135–146)
Total Bilirubin: 0.6 mg/dL (ref 0.2–1.2)
Total Protein: 6.7 g/dL (ref 6.1–8.1)
eGFR: 78 mL/min/{1.73_m2} (ref >=60)

## 2021-06-23 LAB — LIPID PANEL
Cholesterol: 244 mg/dL — ABNORMAL HIGH (ref <200)
HDL: 45 mg/dL (ref >=40)
LDL Cholesterol (Calc): 174 mg/dL (calc) — ABNORMAL HIGH
Non-HDL Cholesterol (Calc): 199 mg/dL (calc) — ABNORMAL HIGH (ref <130)
Total CHOL/HDL Ratio: 5.4 (calc) — ABNORMAL HIGH (ref <5.0)
Triglycerides: 118 mg/dL (ref <150)

## 2021-06-23 LAB — PSA: PSA: 3.12 ng/mL (ref <=4.00)

## 2021-06-23 LAB — CBC WITH DIFFERENTIAL/PLATELET
Absolute Monocytes: 576 cells/uL (ref 200–950)
Basophils Absolute: 58 cells/uL (ref 0–200)
Basophils Relative: 0.8 %
Eosinophils Absolute: 58 cells/uL (ref 15–500)
Eosinophils Relative: 0.8 %
HCT: 47.2 % (ref 38.5–50.0)
Hemoglobin: 15.7 g/dL (ref 13.2–17.1)
Lymphs Abs: 1620 cells/uL (ref 850–3900)
MCH: 30.8 pg (ref 27.0–33.0)
MCHC: 33.3 g/dL (ref 32.0–36.0)
MCV: 92.7 fL (ref 80.0–100.0)
MPV: 10.7 fL (ref 7.5–12.5)
Monocytes Relative: 8 %
Neutro Abs: 4889 cells/uL (ref 1500–7800)
Neutrophils Relative %: 67.9 %
Platelets: 263 10*3/uL (ref 140–400)
RBC: 5.09 10*6/uL (ref 4.20–5.80)
RDW: 13.2 % (ref 11.0–15.0)
Total Lymphocyte: 22.5 %
WBC: 7.2 10*3/uL (ref 3.8–10.8)

## 2021-06-23 LAB — MICROALBUMIN / CREATININE URINE RATIO
Creatinine, Urine: 154 mg/dL (ref 20–320)
Microalb Creat Ratio: 3 mcg/mg creat (ref <30)
Microalb, Ur: 0.5 mg/dL

## 2021-06-23 LAB — URINALYSIS, ROUTINE W REFLEX MICROSCOPIC
Bilirubin Urine: NEGATIVE
Glucose, UA: NEGATIVE
Hgb urine dipstick: NEGATIVE
Ketones, ur: NEGATIVE
Leukocytes,Ua: NEGATIVE
Nitrite: NEGATIVE
Protein, ur: NEGATIVE
Specific Gravity, Urine: 1.021 (ref 1.001–1.035)
pH: <=5 (ref 5.0–8.0)

## 2021-06-23 LAB — TSH: TSH: 1.98 mIU/L (ref 0.40–4.50)

## 2021-06-23 LAB — INSULIN, RANDOM: Insulin: 7.3 u[IU]/mL

## 2021-06-23 LAB — VITAMIN D 25 HYDROXY (VIT D DEFICIENCY, FRACTURES): Vit D, 25-Hydroxy: 51 ng/mL (ref 30–100)

## 2021-06-23 LAB — HEMOGLOBIN A1C
Hgb A1c MFr Bld: 5.5 % of total Hgb (ref <5.7)
Mean Plasma Glucose: 111 mg/dL
eAG (mmol/L): 6.2 mmol/L

## 2021-11-03 LAB — COLOGUARD: COLOGUARD: NEGATIVE

## 2021-11-03 NOTE — Progress Notes (Signed)
<><><><><><><><><><><><><><><><><><><><><><><><><><><><><><><><><> <><><><><><><><><><><><><><><><><><><><><><><><><><><><><><><><><>  -   Cologard  - Negative  - Great !                                                           -   Recommend repeat in 3 years !  <><><><><><><><><><><><><><><><><><><><><><><><><><><><><><><><><> <><><><><><><><><><><><><><><><><><><><><><><><><><><><><><><><><>  

## 2021-11-03 NOTE — Progress Notes (Signed)
Patient is aware of cologuard results. -e welch

## 2022-01-09 ENCOUNTER — Encounter: Payer: Self-pay | Admitting: Nurse Practitioner

## 2022-01-09 ENCOUNTER — Ambulatory Visit: Payer: Medicare Other | Admitting: Nurse Practitioner

## 2022-01-09 VITALS — Temp 97.6°F | Ht 68.0 in | Wt 208.0 lb

## 2022-01-09 DIAGNOSIS — U071 COVID-19: Secondary | ICD-10-CM | POA: Diagnosis not present

## 2022-01-09 MED ORDER — DEXAMETHASONE 1 MG PO TABS: ORAL_TABLET | ORAL | 0 refills | Status: DC

## 2022-01-09 NOTE — Progress Notes (Signed)
THIS ENCOUNTER IS A VIRTUAL VISIT DUE TO COVID-19 - PATIENT WAS NOT SEEN IN THE OFFICE.  PATIENT HAS CONSENTED TO VIRTUAL VISIT / TELEMEDICINE VISIT   Virtual Visit via telephone Note  I connected with  Kenneth Cantu on 01/09/2022 by telephone.  I verified that I am speaking with the correct person using two identifiers.    I discussed the limitations of evaluation and management by telemedicine and the availability of in person appointments. The patient expressed understanding and agreed to proceed.  History of Present Illness:  Temp 97.6 F (36.4 C)   Ht 5\' 8"  (1.727 m)   Wt 208 lb (94.3 kg)   BMI 31.63 kg/m  72 y.o. patient contacted office reporting URI sx . he tested positive by home test yesterday. OV was conducted by telephone to minimize exposure. This patient was vaccinated for covid 19, last 04/2019    Sx began 1 days ago with fever, congestion, fatigue, chills, headache  Treatments tried so far: Dayquil and Nyquil  Exposures: unknown   Medications     Current Outpatient Medications (Analgesics):    ASPIRIN 81 PO, Take by mouth daily.   Current Outpatient Medications (Other):    Ascorbic Acid (VITAMIN C) 1000 MG tablet, Take 1,000 mg by mouth daily.   cholecalciferol (VITAMIN D) 1000 units tablet, Take 1,000 Units by mouth 2 (two) times daily.   Multiple Vitamins-Minerals (MULTIVITAMIN & MINERAL PO), Take by mouth daily.   TURMERIC PO, Take by mouth daily.   Zinc 50 MG TABS, Take by mouth.  Allergies: No Known Allergies  Problem list He has Hyperlipidemia, mixed; Labile hypertension; Vitamin D deficiency; Obesity (BMI 30.0-34.9); Abnormal glucose (hx of prediabetes) ; and TMJ (temporomandibular joint syndrome) on their problem list.   Social History:   reports that he quit smoking about 38 years ago. His smoking use included cigarettes. He has never used smokeless tobacco. He reports that he does not drink alcohol and does not use  drugs.  Observations/Objective:  General : Well sounding patient in no apparent distress HEENT: no hoarseness, no cough for duration of visit Lungs: speaks in complete sentences, no audible wheezing, no apparent distress Neurological: alert, oriented x 3 Psychiatric: pleasant, judgement appropriate   Assessment and Plan:  Covid 19 Covid 19 positive per rapid screening test at home Risk factors include: has Hyperlipidemia, mixed; Labile hypertension; Vitamin D deficiency; Obesity (BMI 30.0-34.9); Abnormal glucose (hx of prediabetes) ; and TMJ (temporomandibular joint syndrome) on their problem list.  Symptoms are: mild Immue support reviewed Vit C, Vit D Zinc Take tylenol PRN temp 101+ Push hydration Regular ambulation or calf exercises exercises for clot prevention and 81 mg ASA unless contraindicated Sx supportive therapy suggested Follow up via mychart or telephone if needed Advised patient obtain O2 monitor; present to ED if persistently <90% or with severe dyspnea, CP, fever uncontrolled by tylenol, confusion, sudden decline Should remain in isolation 5 days from testing positive and then wear a mask when around other people for the following 5 days   COVID -     dexamethasone (DECADRON) 1 MG tablet; Take 3 tabs for 3 days, 2 tabs for 3 days 1 tab for 5 days. Take with food.      Follow Up Instructions:  I discussed the assessment and treatment plan with the patient. The patient was provided an opportunity to ask questions and all were answered. The patient agreed with the plan and demonstrated an understanding of the instructions.   The  patient was advised to call back or seek an in-person evaluation if the symptoms worsen or if the condition fails to improve as anticipated.  I provided 20 minutes of non-face-to-face time during this encounter.   Alycia Rossetti, NP

## 2022-01-09 NOTE — Patient Instructions (Signed)
Immue support reviewed Vit C, Vit D Zinc Take tylenol PRN temp 101+ Push hydration Regular ambulation or calf exercises exercises for clot prevention and 81 mg ASA unless contraindicated Sx supportive therapy suggested Follow up via mychart or telephone if needed Advised patient obtain O2 monitor; present to ED if persistently <90% or with severe dyspnea, CP, fever uncontrolled by tylenol, confusion, sudden decline Should remain in isolation 5 days from testing positive and then wear a mask when around other people for the following 5 days

## 2022-06-24 ENCOUNTER — Encounter: Payer: Self-pay | Admitting: Internal Medicine

## 2022-06-24 NOTE — Patient Instructions (Signed)

## 2022-06-24 NOTE — Progress Notes (Signed)
Annual  Screening/Preventative Visit  & Comprehensive Evaluation & Examination  (Patient has  Refused Recommended  Medicare Wellness Visits in the past)  Future Appointments  Date Time Provider Department  06/25/2022 10:00 AM Lucky Cowboy, MD [facility]  07/06/2023 10:00 AM Lucky Cowboy, MD [facility]       This very nice 72 y.o.  [name]   with  labile HTN, HLD, Prediabetes and Vitamin D Deficiency  presents for a Screening / Preventative Visit & comprehensive evaluation and management of multiple medical co-morbidities.        Patient has been followed for labile HTN circa 1995. Patient's  alleges BP has been controlled at home.  Today's BP at goal -  138/88.  Patient denies any cardiac symptoms as chest pain, palpitations, shortness of breath, dizziness or ankle swelling.       Patient's hyperlipidemia is not controlled with diet as patient is reticent to take meds for Cholesterol. Last lipids were not at goal:  Lab Results  Component Value Date   CHOL 244 (H) 06/20/2021   HDL 45 06/20/2021   LDLCALC 174 (H) 06/20/2021   TRIG 118 06/20/2021   CHOLHDL 5.4 (H) 06/20/2021        Patient has moderate Obesity (BMI ~ 32) and consequent prediabetes  (A1c 5.8% 2012)  and patient denies reactive hypoglycemic symptoms, visual blurring, diabetic polys or paresthesias. Last A1c was  normal & at goal :  Lab Results  Component Value Date   HGBA1C 5.5 06/20/2021        Patient has history of Vitamin D Deficiency ("43" /2011) and last vitamin D was at goal:  Lab Results  Component Value Date   VD25OH 51 06/20/2021       Current Outpatient Medications on File Prior to Visit  Medication Sig   ASPIRIN 81 mg Take  daily.   VITAMIN D 2,000 units  Take 2,000 Units  2 x/day times daily. (= 4,000 units/day)   MultiVit w/Minerals  Take  daily.   TURMERIC  Take  daily.    Past Medical History:  Diagnosis Date   Labile hypertension    Mixed hyperlipidemia    Vitamin D  deficiency    Health Maintenance  Topic Date Due   COVID-19 Vaccine (1) Never done   Fecal DNA (Cologuard)  10/18/2021   TETANUS/TDAP  06/10/2024   Hepatitis C Screening  Completed   INFLUENZA VACCINE  Discontinued   PNA vac Low Risk Adult  Discontinued   Immunization History  Administered Date(s) Administered   DT (Pediatric) 06/11/2014   PPD Test 06/08/2013, 06/11/2014    - Cologard Negative - 09/23/2015- and 3 yr f/u due Sept 2020, but patient never returned Cologard kit   Past Surgical History:  Procedure Laterality Date   APPENDECTOMY  1981   ORIF CLAVICLE FRACTURE Left 1970s   REFRACTIVE SURGERY Bilateral 2011   WRIST FRACTURE SURGERY Left 1970's   Family History  Problem Relation Age of Onset   Thyroid disease Mother    Hypertension Father    Benign prostatic hyperplasia Father    Hypertension Brother    Heart disease Brother    Cancer Other        lung   Seizures Other    Social History   Socioeconomic History   Marital status: Single  Occupational History   Retired EMT  Tobacco Use   Smoking status: Former Smoker    Quit date: 09/15/1983    Years since quitting: 36.0  Smokeless tobacco: Never Used  Substance and Sexual Activity   Alcohol use: No   Drug use: No   Sexual activity: Not on file    ROS Constitutional: Denies fever, chills, weight loss/gain, headaches, insomnia,  night sweats or change in appetite. Does c/o fatigue. Eyes: Denies redness, blurred vision, diplopia, discharge, itchy or watery eyes.  ENT: Denies discharge, congestion, post nasal drip, epistaxis, sore throat, earache, hearing loss, dental pain, Tinnitus, Vertigo, Sinus pain or snoring.  Cardio: Denies chest pain, palpitations, irregular heartbeat, syncope, dyspnea, diaphoresis, orthopnea, PND, claudication or edema Respiratory: denies cough, dyspnea, DOE, pleurisy, hoarseness, laryngitis or wheezing.  Gastrointestinal: Denies dysphagia, heartburn, reflux, water brash, pain,  cramps, nausea, vomiting, bloating, diarrhea, constipation, hematemesis, melena, hematochezia, jaundice or hemorrhoids Genitourinary: Denies dysuria, frequency, discharge, hematuria or flank pain. Has urgency, nocturia x 2-3 & occasional hesitancy. Musculoskeletal: Denies arthralgia, myalgia, stiffness, Jt. Swelling, pain, limp or strain/sprain. Denies Falls. Skin: Denies puritis, rash, hives, warts, acne, eczema or change in skin lesion Neuro: No weakness, tremor, incoordination, spasms, paresthesia or pain Psychiatric: Denies confusion, memory loss or sensory loss. Denies Depression. Endocrine: Denies change in weight, skin, hair change, nocturia, and paresthesia, diabetic polys, visual blurring or hyper / hypo glycemic episodes.  Heme/Lymph: No excessive bleeding, bruising or enlarged lymph nodes.  Physical Exam  BP 138/88   Pulse (!) 17   Temp 97.9 F (36.6 C)   Resp 17   Ht 5\' 8"  (1.727 m)   Wt 211 lb 12.8 oz (96.1 kg)   SpO2 98%   BMI 32.20 kg/m   General Appearance: Over nourished and in no apparent distress.  Eyes: PERRLA, EOMs, conjunctiva no swelling or erythema, normal fundi and vessels. Sinuses: No frontal/maxillary tenderness ENT/Mouth: EACs patent / TMs  nl. Nares clear without erythema, swelling, mucoid exudates. Oral hygiene is good. No erythema, swelling, or exudate. Tongue normal, non-obstructing. Tonsils not swollen or erythematous. Hearing normal.  Neck: Supple, thyroid not palpable. No bruits, nodes or JVD. Respiratory: Respiratory effort normal.  BS equal and clear bilateral without rales, rhonci, wheezing or stridor. Cardio: Heart sounds are normal with regular rate and rhythm and no murmurs, rubs or gallops. Peripheral pulses are normal and equal bilaterally without edema. No aortic or femoral bruits. Chest: symmetric with normal excursions and percussion.  Abdomen: Soft, with Nl bowel sounds. Nontender, no guarding, rebound, hernias, masses, or organomegaly.   Lymphatics: Non tender without lymphadenopathy.  Musculoskeletal: Full ROM all peripheral extremities, joint stability, 5/5 strength, and normal gait. Skin: Warm and dry without rashes, lesions, cyanosis, clubbing or  ecchymosis.  Neuro: Cranial nerves intact, reflexes equal bilaterally. Normal muscle tone, no cerebellar symptoms. Sensation intact.  Pysch: Alert and oriented X 3 with normal affect, insight and judgment appropriate.   Assessment and Plan  1. Annual Preventative/Screening Exam    2. Labile hypertension  - EKG 12-Lead - [place], RETROPERITNL ABD,  LTD - Urinalysis, Routine w reflex microscopic - Microalbumin / creatinine urine ratio - CBC with Differential/Platelet - COMPLETE METABOLIC PANEL WITH GFR - Magnesium - TSH  3. Hyperlipidemia, mixed  - EKG 12-Lead - [place], RETROPERITNL ABD,  LTD - Lipid panel - TSH  4. Abnormal glucose  - EKG 12-Lead - [place], RETROPERITNL ABD,  LTD - Hemoglobin A1c - Insulin, random  5. Vitamin D deficiency  - VITAMIN D 25 Hydroxy  6. BPH with obstruction/lower urinary tract symptoms  - PSA  7. Screening for colorectal cancer  - Cologuard  8. Prostate cancer screening  -  PSA  9. Screening for ischemic heart disease  - EKG 12-Lead  10. FHx: heart disease  - EKG 12-Lead - [place], RETROPERITNL ABD,  LTD  11. Former smoker  - EKG 12-Lead - [place], RETROPERITNL ABD,  LTD  12. Screening for AAA   - [place], RETROPERITNL ABD,  LTD  13. Medication management  - Urinalysis, Routine w reflex microscopic - Microalbumin / creatinine urine ratio - CBC with Differential/Platelet - COMPLETE METABOLIC PANEL WITH GFR - Magnesium - Lipid panel - TSH - Hemoglobin A1c - Insulin, random - VITAMIN D 25 Hydroxy          Patient was counseled in prudent diet, weight control to achieve/maintain BMI less than 25, BP monitoring, regular exercise and medications as discussed.  Discussed med effects and SE's. Routine screening labs and tests  as requested with regular follow-up as recommended. Over 40 minutes of exam, counseling, chart review and high complex critical decision making was performed   Marinus Maw, MD

## 2022-06-25 ENCOUNTER — Encounter: Payer: Self-pay | Admitting: Internal Medicine

## 2022-06-25 ENCOUNTER — Ambulatory Visit (INDEPENDENT_AMBULATORY_CARE_PROVIDER_SITE_OTHER): Payer: Medicare Other | Admitting: Internal Medicine

## 2022-06-25 VITALS — BP 138/88 | HR 17 | Temp 97.9°F | Resp 17 | Ht 68.0 in | Wt 211.8 lb

## 2022-06-25 DIAGNOSIS — I700 Atherosclerosis of aorta: Secondary | ICD-10-CM

## 2022-06-25 DIAGNOSIS — R0989 Other specified symptoms and signs involving the circulatory and respiratory systems: Secondary | ICD-10-CM | POA: Diagnosis not present

## 2022-06-25 DIAGNOSIS — Z8249 Family history of ischemic heart disease and other diseases of the circulatory system: Secondary | ICD-10-CM

## 2022-06-25 DIAGNOSIS — Z79899 Other long term (current) drug therapy: Secondary | ICD-10-CM

## 2022-06-25 DIAGNOSIS — Z87891 Personal history of nicotine dependence: Secondary | ICD-10-CM

## 2022-06-25 DIAGNOSIS — Z1211 Encounter for screening for malignant neoplasm of colon: Secondary | ICD-10-CM

## 2022-06-25 DIAGNOSIS — R7309 Other abnormal glucose: Secondary | ICD-10-CM

## 2022-06-25 DIAGNOSIS — E782 Mixed hyperlipidemia: Secondary | ICD-10-CM

## 2022-06-25 DIAGNOSIS — E559 Vitamin D deficiency, unspecified: Secondary | ICD-10-CM

## 2022-06-25 DIAGNOSIS — N401 Enlarged prostate with lower urinary tract symptoms: Secondary | ICD-10-CM

## 2022-06-25 DIAGNOSIS — Z0000 Encounter for general adult medical examination without abnormal findings: Secondary | ICD-10-CM

## 2022-06-25 DIAGNOSIS — Z136 Encounter for screening for cardiovascular disorders: Secondary | ICD-10-CM

## 2022-06-25 DIAGNOSIS — Z125 Encounter for screening for malignant neoplasm of prostate: Secondary | ICD-10-CM

## 2022-06-25 DIAGNOSIS — Z0001 Encounter for general adult medical examination with abnormal findings: Secondary | ICD-10-CM

## 2022-06-25 LAB — HEMOGLOBIN A1C: eAG (mmol/L): 6.6 mmol/L

## 2022-06-25 LAB — LIPID PANEL
Cholesterol: 223 mg/dL — ABNORMAL HIGH (ref <200)
Non-HDL Cholesterol (Calc): 181 mg/dL (calc) — ABNORMAL HIGH (ref <130)

## 2022-06-26 LAB — URINALYSIS, ROUTINE W REFLEX MICROSCOPIC
Bilirubin Urine: NEGATIVE
Glucose, UA: NEGATIVE
Hgb urine dipstick: NEGATIVE
Ketones, ur: NEGATIVE
Leukocytes,Ua: NEGATIVE
Nitrite: NEGATIVE
Protein, ur: NEGATIVE
Specific Gravity, Urine: 1.023 (ref 1.001–1.035)
pH: 5.5 (ref 5.0–8.0)

## 2022-06-26 LAB — MICROALBUMIN / CREATININE URINE RATIO
Creatinine, Urine: 158 mg/dL (ref 20–320)
Microalb Creat Ratio: 2 mg/g creat (ref <30)
Microalb, Ur: 0.3 mg/dL

## 2022-06-26 LAB — COMPLETE METABOLIC PANEL WITH GFR
AG Ratio: 1.7 (calc) (ref 1.0–2.5)
ALT: 15 U/L (ref 9–46)
AST: 14 U/L (ref 10–35)
Albumin: 4.5 g/dL (ref 3.6–5.1)
Alkaline phosphatase (APISO): 56 U/L (ref 35–144)
BUN: 17 mg/dL (ref 7–25)
CO2: 28 mmol/L (ref 20–32)
Calcium: 9.4 mg/dL (ref 8.6–10.3)
Chloride: 103 mmol/L (ref 98–110)
Creat: 1.07 mg/dL (ref 0.70–1.28)
Globulin: 2.6 g/dL (calc) (ref 1.9–3.7)
Glucose, Bld: 96 mg/dL (ref 65–99)
Potassium: 4.2 mmol/L (ref 3.5–5.3)
Sodium: 139 mmol/L (ref 135–146)
Total Bilirubin: 0.6 mg/dL (ref 0.2–1.2)
Total Protein: 7.1 g/dL (ref 6.1–8.1)
eGFR: 74 mL/min/{1.73_m2} (ref >=60)

## 2022-06-26 LAB — LIPID PANEL
HDL: 42 mg/dL (ref >=40)
LDL Cholesterol (Calc): 150 mg/dL (calc) — ABNORMAL HIGH
Total CHOL/HDL Ratio: 5.3 (calc) — ABNORMAL HIGH (ref <5.0)
Triglycerides: 172 mg/dL — ABNORMAL HIGH (ref <150)

## 2022-06-26 LAB — PSA: PSA: 3.86 ng/mL (ref <=4.00)

## 2022-06-26 LAB — CBC WITH DIFFERENTIAL/PLATELET
Absolute Monocytes: 693 cells/uL (ref 200–950)
Basophils Absolute: 39 cells/uL (ref 0–200)
Basophils Relative: 0.5 %
Eosinophils Absolute: 100 cells/uL (ref 15–500)
Eosinophils Relative: 1.3 %
HCT: 47.2 % (ref 38.5–50.0)
Hemoglobin: 15.8 g/dL (ref 13.2–17.1)
Lymphs Abs: 1771 cells/uL (ref 850–3900)
MCH: 30.6 pg (ref 27.0–33.0)
MCHC: 33.5 g/dL (ref 32.0–36.0)
MCV: 91.5 fL (ref 80.0–100.0)
MPV: 10.9 fL (ref 7.5–12.5)
Monocytes Relative: 9 %
Neutro Abs: 5097 cells/uL (ref 1500–7800)
Neutrophils Relative %: 66.2 %
Platelets: 248 10*3/uL (ref 140–400)
RBC: 5.16 10*6/uL (ref 4.20–5.80)
RDW: 13.1 % (ref 11.0–15.0)
Total Lymphocyte: 23 %
WBC: 7.7 10*3/uL (ref 3.8–10.8)

## 2022-06-26 LAB — HEMOGLOBIN A1C
Hgb A1c MFr Bld: 5.8 % of total Hgb — ABNORMAL HIGH (ref <5.7)
Mean Plasma Glucose: 120 mg/dL

## 2022-06-26 LAB — INSULIN, RANDOM: Insulin: 7.2 u[IU]/mL

## 2022-06-26 LAB — VITAMIN D 25 HYDROXY (VIT D DEFICIENCY, FRACTURES): Vit D, 25-Hydroxy: 62 ng/mL (ref 30–100)

## 2022-06-26 LAB — TSH: TSH: 2.42 mIU/L (ref 0.40–4.50)

## 2022-06-26 LAB — MAGNESIUM: Magnesium: 2.3 mg/dL (ref 1.5–2.5)

## 2022-06-27 ENCOUNTER — Encounter: Payer: Self-pay | Admitting: Internal Medicine

## 2023-03-11 ENCOUNTER — Encounter: Payer: Self-pay | Admitting: *Deleted

## 2023-05-17 ENCOUNTER — Ambulatory Visit (HOSPITAL_BASED_OUTPATIENT_CLINIC_OR_DEPARTMENT_OTHER)

## 2023-05-17 ENCOUNTER — Encounter (HOSPITAL_BASED_OUTPATIENT_CLINIC_OR_DEPARTMENT_OTHER): Payer: Self-pay | Admitting: Family Medicine

## 2023-05-17 ENCOUNTER — Ambulatory Visit (HOSPITAL_BASED_OUTPATIENT_CLINIC_OR_DEPARTMENT_OTHER): Payer: Self-pay | Admitting: Family Medicine

## 2023-05-17 VITALS — BP 139/86 | HR 78 | Ht 68.0 in | Wt 218.3 lb

## 2023-05-17 DIAGNOSIS — Z0000 Encounter for general adult medical examination without abnormal findings: Secondary | ICD-10-CM

## 2023-05-17 DIAGNOSIS — R7303 Prediabetes: Secondary | ICD-10-CM | POA: Insufficient documentation

## 2023-05-17 DIAGNOSIS — M25551 Pain in right hip: Secondary | ICD-10-CM | POA: Insufficient documentation

## 2023-05-17 DIAGNOSIS — Z125 Encounter for screening for malignant neoplasm of prostate: Secondary | ICD-10-CM

## 2023-05-17 DIAGNOSIS — E559 Vitamin D deficiency, unspecified: Secondary | ICD-10-CM

## 2023-05-17 NOTE — Assessment & Plan Note (Signed)
 We will plan to update labs shortly before next appointment

## 2023-05-17 NOTE — Assessment & Plan Note (Signed)
 Discussed likelihood of underlying arthritis as primary contributor for current symptoms.  Recommend proceeding with x-rays today.  Discussed that if x-rays do show arthritis, considerations include OTC medications, physical therapy, steroid joint injection, surgery.  Can discuss further once imaging report received.

## 2023-05-17 NOTE — Progress Notes (Signed)
 New Patient Office Visit  Subjective   Patient ID: Kenneth Cantu, male    DOB: October 13, 1950  Age: 73 y.o. MRN: [id number]  CC:  Chief Complaint  Patient presents with   New Patient (Initial Visit)    New patient patient has been having pain in hips the right hip is worse than the left this has been going on for several years dr Maeola Schmidt     HPI Kenneth Cantu presents to establish care Last PCP - Dr. Cassondra Cliff  Has been having hip pain. Primarily affecting right hip, started after falling on ice - had slow improvement and then fell again on the hip exactly a year later. Right hip is the one he fell on. Pain over posterior aspect of hip. No radiation of pain. No associated numbness or tingling. Has not tried any specific therapies.  Denies chronic medical issues - chart indicates HLD, PreDM, vit D def, HTN. No current medications being prescribed. Does take vitamins and supplements. Last annual exam and labs were just under 1 year ago.  Patient is originally from [place]. He is retired. 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  Outpatient Encounter Medications as of 05/17/2023  Medication Sig   ASPIRIN 81 PO Take by mouth daily.   cholecalciferol (VITAMIN D ) 1000 units tablet Take 1,000 Units by mouth 2 (two) times daily.   Multiple Vitamins-Minerals (MULTIVITAMIN & MINERAL PO) Take by mouth daily.   TURMERIC PO Take by mouth daily.   [DISCONTINUED] Ascorbic Acid (VITAMIN C) 1000 MG tablet Take 1,000 mg by mouth daily.   [DISCONTINUED] dexamethasone  (DECADRON ) 1 MG tablet Take 3 tabs for 3 days, 2 tabs for 3 days 1 tab for 5 days. Take with food. (Patient not taking: Reported on 06/25/2022)   [DISCONTINUED] Zinc 50 MG TABS Take by mouth. (Patient not taking: Reported on 06/25/2022)   No facility-administered encounter medications on file as of 05/17/2023.    Past Medical History:  Diagnosis Date   Labile hypertension    Mixed hyperlipidemia    Prediabetes 06/11/2014    Testosterone  deficiency 06/11/2014   Vitamin D  deficiency     Past Surgical History:  Procedure Laterality Date   APPENDECTOMY  1981   ORIF CLAVICLE FRACTURE Left 1970s   REFRACTIVE SURGERY Bilateral 2011   WRIST FRACTURE SURGERY Left 1970's    Family History  Problem Relation Age of Onset   Thyroid disease Mother    Hypertension Father    Benign prostatic hyperplasia Father    Hypertension Brother    Heart disease Brother    Cancer Other        lung   Seizures Other     Social History   Socioeconomic History   Marital status: Single    Spouse name: Not on file   Number of children: Not on file   Years of education: Not on file   Highest education level: Not on file  Occupational History   Not on file  Tobacco Use   Smoking status: Former    Current packs/day: 0.00    Types: Cigarettes    Quit date: 09/15/1983    Years since quitting: 39.6    Passive exposure: Past   Smokeless tobacco: Never  Substance and Sexual Activity   Alcohol use: No   Drug use: No   Sexual activity: Not on file  Other Topics Concern   Not on file  Social History Narrative   Not on file   Social Drivers  of Health   Financial Resource Strain: Not on file  Food Insecurity: Not on file  Transportation Needs: Not on file  Physical Activity: Not on file  Stress: Not on file  Social Connections: Not on file  Intimate Partner Violence: Not on file    Objective   BP 139/86 (BP Location: Right Arm, Patient Position: Sitting, Cuff Size: Normal)   Pulse 78   Ht 5\' 8"  (1.727 m)   Wt 218 lb 4.8 oz (99 kg)   SpO2 98%   BMI 33.19 kg/m   Physical Exam  73 year old male in no acute distress Cardiovascular exam with regular rate and rhythm Lungs clear to auscultation bilaterally Right hip: Reduced range of motion in all planes.  Range of motion partly affected by clothing.  No pain with passive range of motion assessment.  Negative FABER and FADIR.  No pain with resisted abduction and  adduction.  Mild discomfort with straight leg raise.  Mild pain with internal rotation with pain over anterior hip.  Assessment & Plan:   Right hip pain Assessment & Plan: Discussed likelihood of underlying arthritis as primary contributor for current symptoms.  Recommend proceeding with x-rays today.  Discussed that if x-rays do show arthritis, considerations include OTC medications, physical therapy, steroid joint injection, surgery.  Can discuss further once imaging report received.  Orders: -     DG HIP UNILAT W OR W/O PELVIS 2-3 VIEWS RIGHT; Future  Vitamin D  deficiency Assessment & Plan: We will plan to update labs shortly before next appointment  Orders: -     VITAMIN D  25 Hydroxy (Vit-D Deficiency, Fractures); Future  Prediabetes -     Hemoglobin A1c; Future  Prostate cancer screening -     PSA Total (Reflex To Free); Future  Wellness examination -     CBC with Differential/Platelet; Future -     Comprehensive metabolic panel with GFR; Future -     Hemoglobin A1c; Future -     Lipid panel; Future -     TSH Rfx on Abnormal to Free T4; Future -     PSA Total (Reflex To Free); Future -     VITAMIN D  25 Hydroxy (Vit-D Deficiency, Fractures); Future  Return in about 3 months (around 08/17/2023) for CPE with fasting labs 1 week prior.    ___________________________________________ Terrilee Dudzik de [place], MD, ABFM, CAQSM Primary Care and Sports Medicine [facility]

## 2023-05-17 NOTE — Patient Instructions (Signed)
  Medication Instructions:  Your physician recommends that you continue on your current medications as directed. Please refer to the Current Medication list given to you today. --If you need a refill on any your medications before your next appointment, please call your pharmacy first. If no refills are authorized on file call the office.-- Lab Work: Your physician has recommended that you have lab work today: 1 week before next appointment If you have labs (blood work) drawn today and your tests are completely normal, you will receive your results via MyChart message OR a phone call from our staff.  Please ensure you check your voicemail in the event that you authorized detailed messages to be left on a delegated number. If you have any lab test that is abnormal or we need to change your treatment, we will call you to review the results.  Referrals/Procedures/Imaging: Xray today   Follow-Up: Your next appointment:   Your physician recommends that you schedule a follow-up appointment in: 2-3 months physical  with Dr. de Peru  You will receive a text message or e-mail with a link to a survey about your care and experience with us  today! We would greatly appreciate your feedback!   Thanks for letting us  be apart of your health journey!!  Primary Care and Sports Medicine   Dr. Court Distance Peru   We encourage you to activate your patient portal called "MyChart".  Sign up information is provided on this After Visit Summary.  MyChart is used to connect with patients for Virtual Visits (Telemedicine).  Patients are able to view lab/test results, encounter notes, upcoming appointments, etc.  Non-urgent messages can be sent to your provider as well. To learn more about what you can do with MyChart, please visit --  [email or web address].

## 2023-05-20 ENCOUNTER — Ambulatory Visit
Admission: EM | Admit: 2023-05-20 | Discharge: 2023-05-20 | Disposition: A | Discharge status: Home / Self Care | Attending: Nurse Practitioner | Admitting: Nurse Practitioner

## 2023-05-20 DIAGNOSIS — H6122 Impacted cerumen, left ear: Secondary | ICD-10-CM | POA: Diagnosis not present

## 2023-05-20 NOTE — Discharge Instructions (Addendum)
 Continue use of the Debrox earwax softener.  Use 2 drops in the left ear twice daily for the next 5 days.  You may follow-up after that time for reattempt to perform ear irrigation for removal of the earwax. You may take over-the-counter Tylenol as needed for pain or discomfort. Apply warm compresses to the ear to help with pain or discomfort. Follow-up in this clinic in 5 days to reattempt ear irrigation. Follow-up as needed.

## 2023-05-20 NOTE — ED Provider Notes (Signed)
 [facility]    CSN: [id number] Arrival date & time: 05/20/23  1234      History   Chief Complaint No chief complaint on file.   HPI Kenneth Cantu is a 73 y.o. male.   The history is provided by the patient.    Patient presents with the left ear feeling clogged due to earwax buildup.  Patient states he tried to clean the ear at home himself, and since that time, the ear is felt more clogged.  He endorses difficulty hearing.  Denies fever, chills, foul-smelling drainage from the ear, dizziness, or lightheadedness.  Patient reports that he has had a prior history of the same.  Has been using Debrox earwax softener and warm water in the ear with minimal relief.  Past Medical History:  Diagnosis Date   Labile hypertension    Mixed hyperlipidemia    Prediabetes 06/11/2014   Testosterone  deficiency 06/11/2014   Vitamin D  deficiency     Patient Active Problem List   Diagnosis Date Noted   Right hip pain 05/17/2023   Prediabetes 05/17/2023   Abnormal glucose (hx of prediabetes)  12/10/2017   TMJ (temporomandibular joint syndrome) 12/10/2017   Obesity (BMI 30.0-34.9) 06/08/2013   Hyperlipidemia, mixed    Labile hypertension    Vitamin D  deficiency     Past Surgical History:  Procedure Laterality Date   APPENDECTOMY  1981   ORIF CLAVICLE FRACTURE Left 1970s   REFRACTIVE SURGERY Bilateral 2011   WRIST FRACTURE SURGERY Left 1970's       Home Medications    Prior to Admission medications   Medication Sig Start Date End Date Taking? Authorizing Provider  ASPIRIN 81 PO Take by mouth daily.    [provider]  cholecalciferol (VITAMIN D ) 1000 units tablet Take 1,000 Units by mouth 2 (two) times daily.    [provider]  Multiple Vitamins-Minerals (MULTIVITAMIN & MINERAL PO) Take by mouth daily.    [provider]  TURMERIC PO Take by mouth daily.    [provider]    Family History Family History  Problem Relation Age  of Onset   Thyroid disease Mother    Hypertension Father    Benign prostatic hyperplasia Father    Hypertension Brother    Heart disease Brother    Cancer Other        lung   Seizures Other     Social History Social History   Tobacco Use   Smoking status: Former    Current packs/day: 0.00    Types: Cigarettes    Quit date: 09/15/1983    Years since quitting: 39.7    Passive exposure: Past   Smokeless tobacco: Never  Substance Use Topics   Alcohol use: No   Drug use: No     Allergies   Patient has no known allergies.   Review of Systems Review of Systems Per HPI  Physical Exam Triage Vital Signs ED Triage Vitals  Encounter Vitals Group     BP 05/20/23 1323 126/75     Systolic BP Percentile --      Diastolic BP Percentile --      Pulse Rate 05/20/23 1323 75     Resp 05/20/23 1323 20     Temp 05/20/23 1323 98.6 F (37 C)     Temp Source 05/20/23 1323 Oral     SpO2 05/20/23 1323 95 %     Weight --      Height --  Head Circumference --      Peak Flow --      Pain Score 05/20/23 1326 0     Pain Loc --      Pain Education --      Exclude from Growth Chart --    No data found.  Updated Vital Signs BP 126/75 (BP Location: Right Arm)   Pulse 75   Temp 98.6 F (37 C) (Oral)   Resp 20   SpO2 95%   Visual Acuity Right Eye Distance:   Left Eye Distance:   Bilateral Distance:    Right Eye Near:   Left Eye Near:    Bilateral Near:     Physical Exam Vitals and nursing note reviewed.  Constitutional:      General: He is not in acute distress.    Appearance: Normal appearance.  HENT:     Head: Normocephalic.     Right Ear: Tympanic membrane, ear canal and external ear normal.     Left Ear: External ear normal. There is impacted cerumen.     Nose: Nose normal.  Pulmonary:     Effort: Pulmonary effort is normal.  Skin:    General: Skin is warm and dry.  Neurological:     General: No focal deficit present.     Mental Status: He is alert and  oriented to person, place, and time.  Psychiatric:        Mood and Affect: Mood normal.        Behavior: Behavior normal.      UC Treatments / Results  Labs (all labs ordered are listed, but only abnormal results are displayed) Labs Reviewed - No data to display  EKG   Radiology No results found.  Procedures Procedures (including critical care time)  Medications Ordered in UC Medications - No data to display  Initial Impression / Assessment and Plan / UC Course  I have reviewed the triage vital signs and the nursing notes.  Pertinent labs & imaging results that were available during my care of the patient were reviewed by me and considered in my medical decision making (see chart for details).   Numerous attempts at ear irrigation along with use of curette for earwax removal with no success.  Will have patient continue over-the-counter Debrox earwax softener for the next 5 days and then follow-up for reattempt for ear irrigation.  Supportive care recommendations were provided and discussed with the patient to include over-the-counter analgesics and warm compresses.  Patient was in agreement with this plan of care and verbalized understanding.  All questions were answered.  Patient stable for discharge.   Final Clinical Impressions(s) / UC Diagnoses   Final diagnoses:  None   Discharge Instructions   None    ED Prescriptions   None    PDMP not reviewed this encounter.   Hardy Lia, NP 05/20/23 1425

## 2023-05-20 NOTE — ED Triage Notes (Signed)
 Pt reports left ear fullness x 2 days has tried debrox drops and warm water in the shower. Has found no relief.

## 2023-06-08 ENCOUNTER — Ambulatory Visit (HOSPITAL_BASED_OUTPATIENT_CLINIC_OR_DEPARTMENT_OTHER): Payer: Self-pay | Admitting: Family Medicine

## 2023-06-08 MED ORDER — MELOXICAM 7.5 MG PO TABS: 7.5000 mg | ORAL_TABLET | Freq: Every day | ORAL | 1 refills | Status: DC

## 2023-06-08 NOTE — Progress Notes (Signed)
 Called and discussed results of imaging with patient.  Reviewed findings of right hip osteoarthritis.  I do feel that this is primary cause for patient's ongoing symptoms.  We discussed treatment options.  Patient would like to proceed with meloxicam, cautioned on potential side effects.  We also discussed possible referral to physical therapy, steroid injection, surgical consideration.  For now he will start with medication, but may want to have referral to physical therapy pending progress.  He will call or send us  a message if you would like referral placed.

## 2023-07-06 ENCOUNTER — Encounter: Payer: Medicare Other | Admitting: Internal Medicine

## 2023-07-27 ENCOUNTER — Encounter (HOSPITAL_BASED_OUTPATIENT_CLINIC_OR_DEPARTMENT_OTHER): Payer: Self-pay

## 2023-07-27 ENCOUNTER — Ambulatory Visit (HOSPITAL_BASED_OUTPATIENT_CLINIC_OR_DEPARTMENT_OTHER)

## 2023-07-27 DIAGNOSIS — Z0000 Encounter for general adult medical examination without abnormal findings: Secondary | ICD-10-CM | POA: Diagnosis not present

## 2023-07-27 NOTE — Progress Notes (Signed)
 Subjective:   Kenneth Cantu is a 73 y.o. male who presents for Medicare Annual/Subsequent preventive examination.  Visit Complete: Virtual I connected with  Cantu Kenneth Kay on 07/27/23 by a audio enabled telemedicine application and verified that I am speaking with the correct person using two identifiers.  Patient Location: Home  Provider Location: Home Office  I discussed the limitations of evaluation and management by telemedicine. The patient expressed understanding and agreed to proceed.  Vital Signs: Because this visit was a virtual/telehealth visit, some criteria may be missing or patient reported. Any vitals not documented were not able to be obtained and vitals that have been documented are patient reported.  Patient Medicare AWV questionnaire was completed by the patient on 07/27/23; I have confirmed that all information answered by patient is correct and no changes since this date.        Objective:    There were no vitals filed for this visit. There is no height or weight on file to calculate BMI.     03/28/2020    2:54 PM 12/10/2017   10:07 AM 08/27/2015   10:54 AM  Advanced Directives  Does Patient Have a Medical Advance Directive? No Yes  Yes   Type of Special educational needs teacher of [place];Living will   Does patient want to make changes to medical advance directive? No - Patient declined    Copy of Healthcare Power of Attorney in Chart?  No - copy requested  Yes   Would patient like information on creating a medical advance directive? No - Patient declined       Data saved with a previous flowsheet row definition     Current Medications (verified) Outpatient Encounter Medications as of 07/27/2023  Medication Sig   ASPIRIN 81 PO Take by mouth daily.   cholecalciferol (VITAMIN D ) 1000 units tablet Take 1,000 Units by mouth 2 (two) times daily.   meloxicam  (MOBIC ) 7.5 MG tablet Take 1 tablet (7.5 mg total) by mouth daily.   Multiple  Vitamins-Minerals (MULTIVITAMIN & MINERAL PO) Take by mouth daily.   TURMERIC PO Take by mouth daily.   No facility-administered encounter medications on file as of 07/27/2023.    Allergies (verified) Patient has no known allergies.   History: Past Medical History:  Diagnosis Date   Labile hypertension    Mixed hyperlipidemia    Prediabetes 06/11/2014   Testosterone  deficiency 06/11/2014   Vitamin D  deficiency    Past Surgical History:  Procedure Laterality Date   APPENDECTOMY  1981   ORIF CLAVICLE FRACTURE Left 1970s   REFRACTIVE SURGERY Bilateral 2011   WRIST FRACTURE SURGERY Left 1970's   Family History  Problem Relation Age of Onset   Thyroid disease Mother    Hypertension Father    Benign prostatic hyperplasia Father    Hypertension Brother    Heart disease Brother    Cancer Other        lung   Seizures Other    Social History   Socioeconomic History   Marital status: Single    Spouse name: Not on file   Number of children: Not on file   Years of education: Not on file   Highest education level: Not on file  Occupational History   Not on file  Tobacco Use   Smoking status: Former    Current packs/day: 0.00    Types: Cigarettes    Quit date: 09/15/1983    Years since quitting: 39.8    Passive exposure:  Past   Smokeless tobacco: Never  Substance and Sexual Activity   Alcohol use: No   Drug use: No   Sexual activity: Not on file  Other Topics Concern   Not on file  Social History Narrative   Not on file   Social Drivers of Health   Financial Resource Strain: Not on file  Food Insecurity: Not on file  Transportation Needs: Not on file  Physical Activity: Not on file  Stress: Not on file  Social Connections: Not on file    Tobacco Counseling Counseling given: Not Answered   Clinical Intake:                        Activities of Daily Living    07/26/2023    4:08 PM  In your present state of health, do you have any difficulty  performing the following activities:  Hearing? 0  Vision? 0  Difficulty concentrating or making decisions? 0  Walking or climbing stairs? 1  Dressing or bathing? 0  Doing errands, shopping? 0  Preparing Food and eating ? N  Using the Toilet? N  In the past six months, have you accidently leaked urine? N  Do you have problems with loss of bowel control? N  Managing your Medications? N  Managing your Finances? N  Housekeeping or managing your Housekeeping? N    Patient Care Team: [place], Quintin PARAS, MD as PCP - General (Family Medicine) Debrah Lamar BIRCH, MD (Inactive) as Consulting Physician (Gastroenterology)  Indicate any recent Medical Services you may have received from other than [facility] providers in the past year (date may be approximate).     Assessment:   This is a routine wellness examination for [employer].  Hearing/Vision screen No results found.   Goals Addressed   None    Depression Screen    05/17/2023    1:06 PM 06/27/2022   10:13 PM 06/22/2021    6:35 PM 03/28/2020    3:03 PM 09/24/2019    9:15 PM 09/11/2018    6:13 PM 12/10/2017   10:36 AM  PHQ 2/9 Scores  PHQ - 2 Score 0 0 0 0 0 0 0  PHQ- 9 Score 0          Fall Risk    07/26/2023    4:08 PM 05/17/2023    1:06 PM 06/27/2022   10:13 PM 06/22/2021    6:34 PM 03/28/2020    2:59 PM  Fall Risk   Falls in the past year? 1 1 0 0 0  Number falls in past yr: 0 1   1  Injury with Fall? 0 0   0  Risk for fall due to :  History of fall(s) No Fall Risks No Fall Risks No Fall Risks  Follow up  Falls evaluation completed;Education provided;Falls prevention discussed Falls prevention discussed;Education provided;Falls evaluation completed Falls evaluation completed;Education provided;Falls prevention discussed  Falls prevention discussed;Falls evaluation completed      Data saved with a previous flowsheet row definition     MEDICARE RISK AT HOME: Medicare Risk at Home Any stairs in or around the home?: (Patient-Rptd)  Yes If so, are there any without handrails?: (Patient-Rptd) No Home free of loose throw rugs in walkways, pet beds, electrical cords, etc?: (Patient-Rptd) No Adequate lighting in your home to reduce risk of falls?: (Patient-Rptd) Yes Life alert?: (Patient-Rptd) No Use of a cane, walker or w/c?: (Patient-Rptd) No Grab bars in the bathroom?: (Patient-Rptd) No Shower  chair or bench in shower?: (Patient-Rptd) No Elevated toilet seat or a handicapped toilet?: (Patient-Rptd) No  TIMED UP AND GO:  Was the test performed?  No    Cognitive Function:        Immunizations Immunization History  Administered Date(s) Administered   DT (Pediatric) 06/11/2014   Moderna Sars-Covid-2 Vaccination 03/22/2019, 04/26/2019   PPD Test 06/08/2013, 06/11/2014    TDAP status: Up to date  Flu Vaccine status: Declined, Education has been provided regarding the importance of this vaccine but patient still declined. Advised may receive this vaccine at local pharmacy or Health Dept. Aware to provide a copy of the vaccination record if obtained from local pharmacy or Health Dept. Verbalized acceptance and understanding.  Pneumococcal vaccine status: Due, Education has been provided regarding the importance of this vaccine. Advised may receive this vaccine at local pharmacy or Health Dept. Aware to provide a copy of the vaccination record if obtained from local pharmacy or Health Dept. Verbalized acceptance and understanding.  Covid-19 vaccine status: Completed vaccines  Qualifies for Shingles Vaccine? Yes   Zostavax completed No   Shingrix Completed?: No.    Education has been provided regarding the importance of this vaccine. Patient has been advised to call insurance company to determine out of pocket expense if they have not yet received this vaccine. Advised may also receive vaccine at local pharmacy or Health Dept. Verbalized acceptance and understanding.  Screening Tests Health Maintenance  Topic Date  Due   Zoster Vaccines- Shingrix (1 of 2) Never done   Pneumococcal Vaccine: 50+ Years (1 of 1 - PCV) Never done   COVID-19 Vaccine (3 - Moderna risk series) 05/24/2019   DTaP/Tdap/Td (2 - Tdap) 06/10/2024   Medicare Annual Wellness (AWV)  07/26/2024   Fecal DNA (Cologuard)  10/27/2024   Hepatitis C Screening  Completed   Hepatitis B Vaccines  Aged Out   HPV VACCINES  Aged Out   Meningococcal B Vaccine  Aged Out   INFLUENZA VACCINE  Discontinued    Health Maintenance  Health Maintenance Due  Topic Date Due   Zoster Vaccines- Shingrix (1 of 2) Never done   Pneumococcal Vaccine: 50+ Years (1 of 1 - PCV) Never done   COVID-19 Vaccine (3 - Moderna risk series) 05/24/2019    Colorectal cancer screening: Type of screening: Cologuard. Completed 10/27/21. Repeat every 3 years  Lung Cancer Screening: (Low Dose CT Chest recommended if Age 33-80 years, 20 pack-year currently smoking OR have quit w/in 15years.) does not qualify.   Lung Cancer Screening Referral: NA  Additional Screening:  Hepatitis C Screening: does qualify; Completed 06/08/2013  Vision Screening: Recommended annual ophthalmology exams for early detection of glaucoma and other disorders of the eye. Is the patient up to date with their annual eye exam?  Yes  Who is the provider or what is the name of the office in which the patient attends annual eye exams? [facility] [place]  If pt is not established with a provider, would they like to be referred to a provider to establish care? No .   Dental Screening: Recommended annual dental exams for proper oral hygiene   Community Resource Referral / Chronic Care Management: CRR required this visit?  No   CCM required this visit?  No     Plan:     I have personally reviewed and noted the following in the patient's chart:   Medical and social history Use of alcohol, tobacco or illicit drugs  Current medications  and supplements including opioid prescriptions.  Patient is not currently taking opioid prescriptions. Functional ability and status Nutritional status Physical activity Advanced directives List of other physicians Hospitalizations, surgeries, and ER visits in previous 12 months Vitals Screenings to include cognitive, depression, and falls Referrals and appointments  In addition, I have reviewed and discussed with patient certain preventive protocols, quality metrics, and best practice recommendations. A written personalized care plan for preventive services as well as general preventive health recommendations were provided to patient.     Kerri JONELLE Fuel, CMA   07/27/2023   After Visit Summary: (MyChart) Due to this being a telephonic visit, the after visit summary with patients personalized plan was offered to patient via MyChart   Nurse Notes:  Mr. Dooling , Thank you for taking time to come for your Medicare Wellness Visit. I appreciate your ongoing commitment to your health goals. Please review the following plan we discussed and let me know if I can assist you in the future.   These are the goals we discussed:  Goals      Patient Stated     Patient would like to do Physical therapy and rehab on hips meloxicam  has helped        This is a list of the screening recommended for you and due dates:  Health Maintenance  Topic Date Due   Zoster (Shingles) Vaccine (1 of 2) Never done   Pneumococcal Vaccine for age over 33 (1 of 1 - PCV) Never done   COVID-19 Vaccine (3 - Moderna risk series) 05/24/2019   DTaP/Tdap/Td vaccine (2 - Tdap) 06/10/2024   Medicare Annual Wellness Visit  07/26/2024   Cologuard (Stool DNA test)  10/27/2024   Hepatitis C Screening  Completed   Hepatitis B Vaccine  Aged Out   HPV Vaccine  Aged Out   Meningitis B Vaccine  Aged Out   Flu Shot  Discontinued

## 2023-07-27 NOTE — Patient Instructions (Signed)
 Kenneth Cantu , Thank you for taking time to come for your Medicare Wellness Visit. I appreciate your ongoing commitment to your health goals. Please review the following plan we discussed and let me know if I can assist you in the future.   Screening recommendations/referrals: Colonoscopy: Due 10/27/2024 Recommended yearly ophthalmology/optometry visit for glaucoma screening and checkup Recommended yearly dental visit for hygiene and checkup  Vaccinations: Influenza vaccine: Patient declined Pneumococcal vaccine: Due now Tdap vaccine: Due now Shingles vaccine: Due now    Advanced directives: Information mailed to patient   Conditions/risks identified: Falls  Next appointment: 1 year   Preventive Care 73 Years and Older, Male Preventive care refers to lifestyle choices and visits with your health care provider that can promote health and wellness. What does preventive care include? A yearly physical exam. This is also called an annual well check. Dental exams once or twice a year. Routine eye exams. Ask your health care provider how often you should have your eyes checked. Personal lifestyle choices, including: Daily care of your teeth and gums. Regular physical activity. Eating a healthy diet. Avoiding tobacco and drug use. Limiting alcohol use. Practicing safe sex. Taking low doses of aspirin every day. Taking vitamin and mineral supplements as recommended by your health care provider. What happens during an annual well check? The services and screenings done by your health care provider during your annual well check will depend on your age, overall health, lifestyle risk factors, and family history of disease. Counseling  Your health care provider may ask you questions about your: Alcohol use. Tobacco use. Drug use. Emotional well-being. Home and relationship well-being. Sexual activity. Eating habits. History of falls. Memory and ability to understand  (cognition). Work and work Astronomer. Screening  You may have the following tests or measurements: Height, weight, and BMI. Blood pressure. Lipid and cholesterol levels. These may be checked every 5 years, or more frequently if you are over 73 years old. Skin check. Lung cancer screening. You may have this screening every year starting at age 73 if you have a 30-pack-year history of smoking and currently smoke or have quit within the past 15 years. Fecal occult blood test (FOBT) of the stool. You may have this test every year starting at age 73. Flexible sigmoidoscopy or colonoscopy. You may have a sigmoidoscopy every 5 years or a colonoscopy every 10 years starting at age 73. Prostate cancer screening. Recommendations will vary depending on your family history and other risks. Hepatitis C blood test. Hepatitis B blood test. Sexually transmitted disease (STD) testing. Diabetes screening. This is done by checking your blood sugar (glucose) after you have not eaten for a while (fasting). You may have this done every 1-3 years. Abdominal aortic aneurysm (AAA) screening. You may need this if you are a current or former smoker. Osteoporosis. You may be screened starting at age 11 if you are at high risk. Talk with your health care provider about your test results, treatment options, and if necessary, the need for more tests. Vaccines  Your health care provider may recommend certain vaccines, such as: Influenza vaccine. This is recommended every year. Tetanus, diphtheria, and acellular pertussis (Tdap, Td) vaccine. You may need a Td booster every 10 years. Zoster vaccine. You may need this after age 73. Pneumococcal 13-valent conjugate (PCV13) vaccine. One dose is recommended after age 73. Pneumococcal polysaccharide (PPSV23) vaccine. One dose is recommended after age 73. Talk to your health care provider about which screenings and vaccines you need  and how often you need them. This  information is not intended to replace advice given to you by your health care provider. Make sure you discuss any questions you have with your health care provider. Document Released: 01/18/2015 Document Revised: 09/11/2015 Document Reviewed: 10/23/2014 Elsevier Interactive Patient Education  2017 [employer].  Fall Prevention in the Home Falls can cause injuries. They can happen to people of all ages. There are many things you can do to make your home safe and to help prevent falls. What can I do on the outside of my home? Regularly fix the edges of walkways and driveways and fix any cracks. Remove anything that might make you trip as you walk through a door, such as a raised step or threshold. Trim any bushes or trees on the path to your home. Use bright outdoor lighting. Clear any walking paths of anything that might make someone trip, such as rocks or tools. Regularly check to see if handrails are loose or broken. Make sure that both sides of any steps have handrails. Any raised decks and porches should have guardrails on the edges. Have any leaves, snow, or ice cleared regularly. Use sand or salt on walking paths during winter. Clean up any spills in your garage right away. This includes oil or grease spills. What can I do in the bathroom? Use night lights. Install grab bars by the toilet and in the tub and shower. Do not use towel bars as grab bars. Use non-skid mats or decals in the tub or shower. If you need to sit down in the shower, use a plastic, non-slip stool. Keep the floor dry. Clean up any water that spills on the floor as soon as it happens. Remove soap buildup in the tub or shower regularly. Attach bath mats securely with double-sided non-slip rug tape. Do not have throw rugs and other things on the floor that can make you trip. What can I do in the bedroom? Use night lights. Make sure that you have a light by your bed that is easy to reach. Do not use any sheets or  blankets that are too big for your bed. They should not hang down onto the floor. Have a firm chair that has side arms. You can use this for support while you get dressed. Do not have throw rugs and other things on the floor that can make you trip. What can I do in the kitchen? Clean up any spills right away. Avoid walking on wet floors. Keep items that you use a lot in easy-to-reach places. If you need to reach something above you, use a strong step stool that has a grab bar. Keep electrical cords out of the way. Do not use floor polish or wax that makes floors slippery. If you must use wax, use non-skid floor wax. Do not have throw rugs and other things on the floor that can make you trip. What can I do with my stairs? Do not leave any items on the stairs. Make sure that there are handrails on both sides of the stairs and use them. Fix handrails that are broken or loose. Make sure that handrails are as long as the stairways. Check any carpeting to make sure that it is firmly attached to the stairs. Fix any carpet that is loose or worn. Avoid having throw rugs at the top or bottom of the stairs. If you do have throw rugs, attach them to the floor with carpet tape. Make sure that you  have a light switch at the top of the stairs and the bottom of the stairs. If you do not have them, ask someone to add them for you. What else can I do to help prevent falls? Wear shoes that: Do not have high heels. Have rubber bottoms. Are comfortable and fit you well. Are closed at the toe. Do not wear sandals. If you use a stepladder: Make sure that it is fully opened. Do not climb a closed stepladder. Make sure that both sides of the stepladder are locked into place. Ask someone to hold it for you, if possible. Clearly mark and make sure that you can see: Any grab bars or handrails. First and last steps. Where the edge of each step is. Use tools that help you move around (mobility aids) if they are  needed. These include: Canes. Walkers. Scooters. Crutches. Turn on the lights when you go into a dark area. Replace any light bulbs as soon as they burn out. Set up your furniture so you have a clear path. Avoid moving your furniture around. If any of your floors are uneven, fix them. If there are any pets around you, be aware of where they are. Review your medicines with your doctor. Some medicines can make you feel dizzy. This can increase your chance of falling. Ask your doctor what other things that you can do to help prevent falls. This information is not intended to replace advice given to you by your health care provider. Make sure you discuss any questions you have with your health care provider. Document Released: 10/18/2008 Document Revised: 05/30/2015 Document Reviewed: 01/26/2014 Elsevier Interactive Patient Education  2017 [employer].

## 2023-08-11 ENCOUNTER — Other Ambulatory Visit (HOSPITAL_BASED_OUTPATIENT_CLINIC_OR_DEPARTMENT_OTHER): Payer: Self-pay | Admitting: *Deleted

## 2023-08-11 DIAGNOSIS — Z125 Encounter for screening for malignant neoplasm of prostate: Secondary | ICD-10-CM

## 2023-08-11 DIAGNOSIS — R7303 Prediabetes: Secondary | ICD-10-CM

## 2023-08-11 DIAGNOSIS — E559 Vitamin D deficiency, unspecified: Secondary | ICD-10-CM

## 2023-08-11 DIAGNOSIS — Z0000 Encounter for general adult medical examination without abnormal findings: Secondary | ICD-10-CM

## 2023-08-12 LAB — COMPREHENSIVE METABOLIC PANEL WITH GFR
ALT: 14 IU/L (ref 0–44)
AST: 18 IU/L (ref 0–40)
Albumin: 4.3 g/dL (ref 3.8–4.8)
Alkaline Phosphatase: 59 IU/L (ref 44–121)
BUN/Creatinine Ratio: 13 (ref 10–24)
BUN: 14 mg/dL (ref 8–27)
Bilirubin Total: 0.4 mg/dL (ref 0.0–1.2)
CO2: 22 mmol/L (ref 20–29)
Calcium: 9.4 mg/dL (ref 8.6–10.2)
Chloride: 104 mmol/L (ref 96–106)
Creatinine, Ser: 1.11 mg/dL (ref 0.76–1.27)
Globulin, Total: 2.4 g/dL (ref 1.5–4.5)
Glucose: 82 mg/dL (ref 70–99)
Potassium: 4.5 mmol/L (ref 3.5–5.2)
Sodium: 140 mmol/L (ref 134–144)
Total Protein: 6.7 g/dL (ref 6.0–8.5)
eGFR: 70 mL/min/1.73 (ref >59)

## 2023-08-12 LAB — CBC WITH DIFFERENTIAL/PLATELET
Basophils Absolute: 0 x10E3/uL (ref 0.0–0.2)
Basos: 0 %
EOS (ABSOLUTE): 0.1 x10E3/uL (ref 0.0–0.4)
Eos: 2 %
Hematocrit: 45.1 % (ref 37.5–51.0)
Hemoglobin: 14.7 g/dL (ref 13.0–17.7)
Immature Grans (Abs): 0 x10E3/uL (ref 0.0–0.1)
Immature Granulocytes: 0 %
Lymphocytes Absolute: 1.9 x10E3/uL (ref 0.7–3.1)
Lymphs: 28 %
MCH: 30.9 pg (ref 26.6–33.0)
MCHC: 32.6 g/dL (ref 31.5–35.7)
MCV: 95 fL (ref 79–97)
Monocytes Absolute: 0.7 x10E3/uL (ref 0.1–0.9)
Monocytes: 10 %
Neutrophils Absolute: 4.1 x10E3/uL (ref 1.4–7.0)
Neutrophils: 60 %
Platelets: 224 x10E3/uL (ref 150–450)
RBC: 4.76 x10E6/uL (ref 4.14–5.80)
RDW: 13.9 % (ref 11.6–15.4)
WBC: 6.9 x10E3/uL (ref 3.4–10.8)

## 2023-08-12 LAB — LIPID PANEL
Chol/HDL Ratio: 6 ratio — ABNORMAL HIGH (ref 0.0–5.0)
Cholesterol, Total: 229 mg/dL — ABNORMAL HIGH (ref 100–199)
HDL: 38 mg/dL — ABNORMAL LOW (ref >39)
LDL Chol Calc (NIH): 163 mg/dL — ABNORMAL HIGH (ref 0–99)
Triglycerides: 155 mg/dL — ABNORMAL HIGH (ref 0–149)
VLDL Cholesterol Cal: 28 mg/dL (ref 5–40)

## 2023-08-12 LAB — HEMOGLOBIN A1C
Est. average glucose Bld gHb Est-mCnc: 114 mg/dL
Hgb A1c MFr Bld: 5.6 % (ref 4.8–5.6)

## 2023-08-12 LAB — VITAMIN D 25 HYDROXY (VIT D DEFICIENCY, FRACTURES): Vit D, 25-Hydroxy: 51.9 ng/mL (ref 30.0–100.0)

## 2023-08-12 LAB — PSA TOTAL (REFLEX TO FREE): Prostate Specific Ag, Serum: 4.9 ng/mL — ABNORMAL HIGH (ref 0.0–4.0)

## 2023-08-12 LAB — FPSA% REFLEX
% FREE PSA: 14.7 %
PSA, FREE: 0.72 ng/mL

## 2023-08-12 LAB — TSH RFX ON ABNORMAL TO FREE T4: TSH: 2.36 u[IU]/mL (ref 0.450–4.500)

## 2023-08-16 ENCOUNTER — Encounter (HOSPITAL_BASED_OUTPATIENT_CLINIC_OR_DEPARTMENT_OTHER): Payer: Self-pay | Admitting: Family Medicine

## 2023-08-17 MED ORDER — MELOXICAM 7.5 MG PO TABS
7.5000 mg | ORAL_TABLET | Freq: Every day | ORAL | 1 refills | Status: DC
Start: 2023-08-17 — End: 2023-10-18

## 2023-08-18 ENCOUNTER — Encounter (HOSPITAL_BASED_OUTPATIENT_CLINIC_OR_DEPARTMENT_OTHER): Admitting: Family Medicine

## 2023-08-20 ENCOUNTER — Encounter (HOSPITAL_BASED_OUTPATIENT_CLINIC_OR_DEPARTMENT_OTHER): Payer: Self-pay | Admitting: Family Medicine

## 2023-08-20 ENCOUNTER — Ambulatory Visit (INDEPENDENT_AMBULATORY_CARE_PROVIDER_SITE_OTHER): Admitting: Family Medicine

## 2023-08-20 VITALS — BP 159/86 | HR 61 | Ht 68.0 in | Wt 218.0 lb

## 2023-08-20 DIAGNOSIS — E782 Mixed hyperlipidemia: Secondary | ICD-10-CM

## 2023-08-20 DIAGNOSIS — Z0000 Encounter for general adult medical examination without abnormal findings: Secondary | ICD-10-CM | POA: Diagnosis not present

## 2023-08-20 DIAGNOSIS — M25551 Pain in right hip: Secondary | ICD-10-CM

## 2023-08-20 DIAGNOSIS — R972 Elevated prostate specific antigen [PSA]: Secondary | ICD-10-CM | POA: Diagnosis not present

## 2023-08-20 MED ORDER — ATORVASTATIN CALCIUM 10 MG PO TABS: 10.0000 mg | ORAL_TABLET | Freq: Every day | ORAL | 1 refills | Status: DC

## 2023-08-20 NOTE — Assessment & Plan Note (Signed)
 Noted on recent labs, PSA over the last few years has gradually been increasing.  We discussed considerations with elevated PSA and recommendations for further evaluation.  Patient amenable to having evaluation with urology, referral placed today

## 2023-08-20 NOTE — Assessment & Plan Note (Signed)
 Patient has been hesitant in the past regarding statin therapy.  This is something that was discussed with him on numerous occasions with his prior PCP.  He reports that he knows people who have dealt with notable side effects from statins and this is what primarily has contributed to his reluctance to start this class of medication. We did review recent cholesterol panel, does have elevation in total cholesterol as well as LDL cholesterol.  His calculated ASCVD risk score is about 35% presently.  We discussed the meaning of this. We discussed general considerations related to lifestyle modifications, discussed statin therapy, potential risks and benefits associated with this class of medication.  After discussion, patient ultimately elected decided to proceed with statin, we will start with low-dose of statin to assess response with plan to recheck lipid panel in about 2 months.  Pending progress with medication improvement in lipid panel, can assess for further adjustments in dose of medication.

## 2023-08-20 NOTE — Progress Notes (Signed)
 Subjective:    CC: Annual Physical Exam  HPI: Kenneth Cantu is a 73 y.o. presenting for annual physical  I reviewed the past medical history, family history, social history, surgical history, and allergies today and no changes were needed.  Please see the problem list section below in epic for further details.  Past Medical History: Past Medical History:  Diagnosis Date   Labile hypertension    Mixed hyperlipidemia    Prediabetes 06/11/2014   Testosterone  deficiency 06/11/2014   Vitamin D  deficiency    Past Surgical History: Past Surgical History:  Procedure Laterality Date   APPENDECTOMY  1981   ORIF CLAVICLE FRACTURE Left 1970s   REFRACTIVE SURGERY Bilateral 2011   WRIST FRACTURE SURGERY Left 1970's   Social History: Social History   Socioeconomic History   Marital status: Single    Spouse name: Not on file   Number of children: Not on file   Years of education: Not on file   Highest education level: Not on file  Occupational History   Not on file  Tobacco Use   Smoking status: Former    Current packs/day: 0.00    Types: Cigarettes    Quit date: 09/15/1983    Years since quitting: 39.9    Passive exposure: Past   Smokeless tobacco: Never  Vaping Use   Vaping status: Never Used  Substance and Sexual Activity   Alcohol use: No   Drug use: No   Sexual activity: Not on file  Other Topics Concern   Not on file  Social History Narrative   Not on file   Social Drivers of Health   Financial Resource Strain: Low Risk  (07/27/2023)   Overall Financial Resource Strain (CARDIA)    Difficulty of Paying Living Expenses: Not hard at all  Food Insecurity: No Food Insecurity (07/27/2023)   Hunger Vital Sign    Worried About Running Out of Food in the Last Year: Never true    Ran Out of Food in the Last Year: Never true  Transportation Needs: No Transportation Needs (07/27/2023)   PRAPARE - Administrator, Civil Service (Medical): No    Lack of  Transportation (Non-Medical): No  Physical Activity: Inactive (07/27/2023)   Exercise Vital Sign    Days of Exercise per Week: 0 days    Minutes of Exercise per Session: 0 min  Stress: No Stress Concern Present (07/27/2023)   [employer] of Occupational Health - Occupational Stress Questionnaire    Feeling of Stress: Not at all  Social Connections: Socially Isolated (07/27/2023)   Social Connection and Isolation Panel    Frequency of Communication with Friends and Family: More than three times a week    Frequency of Social Gatherings with Friends and Family: More than three times a week    Attends Religious Services: Never    Database administrator or Organizations: No    Attends Engineer, structural: Never    Marital Status: Never married   Family History: Family History  Problem Relation Age of Onset   Thyroid disease Mother    Hypertension Father    Benign prostatic hyperplasia Father    Hypertension Brother    Heart disease Brother    Cancer Other        lung   Seizures Other    Allergies: No Known Allergies Medications: See med rec.  Review of Systems: No headache, visual changes, nausea, vomiting, diarrhea, constipation, dizziness, abdominal pain, skin rash, fevers, chills,  night sweats, swollen lymph nodes, weight loss, chest pain, body aches, joint swelling, muscle aches, shortness of breath, mood changes, visual or auditory hallucinations.  Objective:    BP (!) 159/86   Pulse 61   Ht 5' 8 (1.727 m)   Wt 218 lb (98.9 kg)   SpO2 97%   BMI 33.15 kg/m   General: Well Developed, well nourished, and in no acute distress. Neuro: Alert and oriented x3, extra-ocular muscles intact, sensation grossly intact. Cranial nerves II through XII are intact, motor, sensory, and coordinative functions are all intact. HEENT: Normocephalic, atraumatic, pupils equal round reactive to light, neck supple, no masses, no lymphadenopathy, thyroid nonpalpable. Oropharynx,  nasopharynx, external ear canals are unremarkable. Skin: Warm and dry, no rashes noted. Cardiac: Regular rate and rhythm, no murmurs rubs or gallops. Respiratory: Clear to auscultation bilaterally. Not using accessory muscles, speaking in full sentences. Abdominal: Soft, nontender, nondistended, positive bowel sounds, no masses, no organomegaly. Musculoskeletal: Shoulder, elbow, wrist, hip, knee, ankle stable, and with full range of motion.  Impression and Recommendations:    Wellness examination Assessment & Plan: Routine HCM labs reviewed. HCM reviewed/discussed. Anticipatory guidance regarding healthy weight, lifestyle and choices given. Recommend healthy diet.  Recommend approximately 150 minutes/week of moderate intensity exercise Recommend regular dental and vision exams Always use seatbelt/lap and shoulder restraints Recommend using smoke alarms and checking batteries at least twice a year Recommend using sunscreen when outside Discussed colon cancer screening recommendations, options.  Patient is up-to-date, due for screening next year Discussed recommendations for shingles vaccine.  Patient declines Discussed immunization recommendations The natural history of prostate cancer and ongoing controversy regarding screening and potential treatment outcomes of prostate cancer has been discussed with the patient. The meaning of a false positive PSA and a false negative PSA has been discussed. He indicates understanding of the limitations of this screening test and wishes to proceed with screening PSA testing - elevated on testing, see below for details   Hyperlipidemia, mixed Assessment & Plan: Patient has been hesitant in the past regarding statin therapy.  This is something that was discussed with him on numerous occasions with his prior PCP.  He reports that he knows people who have dealt with notable side effects from statins and this is what primarily has contributed to his reluctance  to start this class of medication. We did review recent cholesterol panel, does have elevation in total cholesterol as well as LDL cholesterol.  His calculated ASCVD risk score is about 35% presently.  We discussed the meaning of this. We discussed general considerations related to lifestyle modifications, discussed statin therapy, potential risks and benefits associated with this class of medication.  After discussion, patient ultimately elected decided to proceed with statin, we will start with low-dose of statin to assess response with plan to recheck lipid panel in about 2 months.  Pending progress with medication improvement in lipid panel, can assess for further adjustments in dose of medication.  Orders: -     Lipid panel; Future  Elevated PSA Assessment & Plan: Noted on recent labs, PSA over the last few years has gradually been increasing.  We discussed considerations with elevated PSA and recommendations for further evaluation.  Patient amenable to having evaluation with urology, referral placed today  Orders: -     Ambulatory referral to Urology  Other orders -     Atorvastatin  Calcium ; Take 1 tablet (10 mg total) by mouth daily.  Dispense: 90 tablet; Refill: 1  Return in about 3  months (around 11/20/2023) for hyperlipidemia.   ___________________________________________ Verdis Bassette de [place], MD, ABFM, [facility]

## 2023-08-20 NOTE — Assessment & Plan Note (Addendum)
 Routine HCM labs reviewed. HCM reviewed/discussed. Anticipatory guidance regarding healthy weight, lifestyle and choices given. Recommend healthy diet.  Recommend approximately 150 minutes/week of moderate intensity exercise Recommend regular dental and vision exams Always use seatbelt/lap and shoulder restraints Recommend using smoke alarms and checking batteries at least twice a year Recommend using sunscreen when outside Discussed colon cancer screening recommendations, options.  Patient is up-to-date, due for screening next year Discussed recommendations for shingles vaccine.  Patient declines Discussed immunization recommendations The natural history of prostate cancer and ongoing controversy regarding screening and potential treatment outcomes of prostate cancer has been discussed with the patient. The meaning of a false positive PSA and a false negative PSA has been discussed. He indicates understanding of the limitations of this screening test and wishes to proceed with screening PSA testing - elevated on testing, see below for details

## 2023-10-06 ENCOUNTER — Ambulatory Visit (HOSPITAL_BASED_OUTPATIENT_CLINIC_OR_DEPARTMENT_OTHER): Attending: Family Medicine | Admitting: Physical Therapy

## 2023-10-06 ENCOUNTER — Other Ambulatory Visit: Payer: Self-pay

## 2023-10-06 DIAGNOSIS — M25551 Pain in right hip: Secondary | ICD-10-CM | POA: Insufficient documentation

## 2023-10-06 DIAGNOSIS — M25651 Stiffness of right hip, not elsewhere classified: Secondary | ICD-10-CM | POA: Insufficient documentation

## 2023-10-06 NOTE — Therapy (Signed)
 OUTPATIENT PHYSICAL THERAPY LOWER EXTREMITY EVALUATION   Patient Name: Kenneth Cantu MRN: [id number] DOB:December 02, 1950, 73 y.o., male Today's Date: 10/07/2023  END OF SESSION:  PT End of Session - 10/06/23 0907     Visit Number 1    Number of Visits 10    Date for Recertification  11/17/23    Authorization Type [employer] MCR    PT Start Time 0808    PT Stop Time 0906    PT Time Calculation (min) 58 min    Activity Tolerance Patient tolerated treatment well    Behavior During Therapy [facility] for tasks assessed/performed          Past Medical History:  Diagnosis Date   Labile hypertension    Mixed hyperlipidemia    Prediabetes 06/11/2014   Testosterone  deficiency 06/11/2014   Vitamin D  deficiency    Past Surgical History:  Procedure Laterality Date   APPENDECTOMY  1981   ORIF CLAVICLE FRACTURE Left 1970s   REFRACTIVE SURGERY Bilateral 2011   WRIST FRACTURE SURGERY Left 1970's   Patient Active Problem List   Diagnosis Date Noted   Wellness examination 08/20/2023   Elevated PSA 08/20/2023   Right hip pain 05/17/2023   Prediabetes 05/17/2023   Abnormal glucose (hx of prediabetes)  12/10/2017   TMJ (temporomandibular joint syndrome) 12/10/2017   Obesity (BMI 30.0-34.9) 06/08/2013   Hyperlipidemia, mixed    Labile hypertension    Vitamin D  deficiency     PCP: de [place], Quintin PARAS, MD  REFERRING PROVIDER: de [place], Raymond J, MD  REFERRING DIAG: [phone] (ICD-10-CM) - Right hip pain   THERAPY DIAG:  Pain in right hip  Stiffness of right hip, not elsewhere classified  Rationale for Evaluation and Treatment: Rehabilitation  ONSET DATE: PT order 08/23/2023  SUBJECTIVE:   SUBJECTIVE STATEMENT: Pt began having problems in his knees which he thinks contributed to his pain.  Several years later, he slipped on ice and fell onto his R hip.  He began having more of a constant pain after the fall.  Pt reports his pain did improve though he fell again 1 year later when slipping on  ice.  His pain worsened after the after the 2nd fall and he had continued pain.  Pt has had no PT for hip pain.      Pt saw PCP who ordered x rays which showed OA. MD prescribed Meloxicam .  Pt reports an 85% improvement in pain with Meloxicam .  Pt states some days he can't lift leg to step over objects including to get into bathtub.  At times, he has difficulty bending to don shoes and socks.  He has a lot of pain if he has a misstep including stepping off a curb or step.  Pt states he has disturbed sleep most nights.  He has increased pain with prolonged standing and sitting.  Pt reports he doesn't have a normal gait and states his feet can drag some.  He has more difficulty descending stairs than ascending stairs.  Pt does a lot of outdoor work including planting.  PERTINENT HISTORY: X rays showed hip OA and decreased bone mineralization. L hip OA and pain L knee arthroscopic surgery L ORIF clavicle fx and wrist fx surgery in 1970's    PAIN:  NPRS:  0/10 current, 5/10 worst in R hip and 3-4/10 in L Location:  R > L anterior hip L hip doesn't bother him too much unless he missteps or jars it.   PRECAUTIONS: Other: decreased bone  mineralization    WEIGHT BEARING RESTRICTIONS: No  FALLS:  Has patient fallen in last 6 months? Yes, once to dodge farm equipment  LIVING ENVIRONMENT: Lives with: significant other Lives in: 3 story home Stairs: yes with rail Has following equipment at home: cane and walking sticks  OCCUPATION: Retired  PLOF: Independent  PATIENT GOALS: to be pain free, to be able to walk outdoors   OBJECTIVE:  Note: Objective measures were completed at Evaluation unless otherwise noted.  DIAGNOSTIC FINDINGS: X rays of bilat hips on 05/22/23:   FINDINGS: There is diffuse decreased bone mineralization.   Severe right greater than left superomedial femoroacetabular joint space narrowing. Superomedial right femoral head 9 mm subchondral cyst. Moderate right  greater than left femoral head-neck junction circumferential degenerative osteophytosis. Mild pubic symphysis and bilateral inferior sacroiliac joint space narrowing and subchondral sclerosis.   No acute fracture or dislocation.   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  PATIENT SURVEYS:  LEFS: 49/80  COGNITION: Overall cognitive status: Within functional limits for tasks assessed        PALPATION: No tenderness with palpation t/o R hip  LOWER EXTREMITY ROM:  Active ROM Right eval Left eval  Hip flexion 88 98  Hip extension    Hip abduction    Hip adduction    Hip internal rotation 8 15  Hip external rotation 25 22  Knee flexion    Knee extension    Ankle dorsiflexion    Ankle plantarflexion    Ankle inversion    Ankle eversion     (Blank rows = not tested)   Pt unable to place either heel on contralateral knee in hooklying.   LOWER EXTREMITY MMT:  MMT Right eval Left eval  Hip flexion 5/5 5/5  Hip extension 5/5 5/5  Hip abduction 5/5 5/5  Hip adduction    Hip internal rotation    Hip external rotation 5/5 w/n available ROM 5/5 w/n available ROM  Knee flexion 5/5 seated 5/5 seated  Knee extension 5/5 5/5  Ankle dorsiflexion    Ankle plantarflexion    Ankle inversion    Ankle eversion     (Blank rows = not tested)  LOWER EXTREMITY SPECIAL TESTS:  Scour's Test:  negative bilat FABER's Test: no pain though very limited with ROM bilat   GAIT: Assistive device utilized: None Level of assistance: Complete Independence Comments: decreased hip mobility, slow gait speed.                                                                                                                                TREATMENT:    Pt performed S/L clams, supine bent knee fall outs, and supine bridging.  Pt received a HEP handout and was educated in correct form and appropriate frequency.  PT instructed pt he shouldn't have  pain with HEP.    PATIENT EDUCATION:  Education details: relevant anatomy, objective findings, POC,  dx, HEP, rationale of interventions.  PT answered pt's questions.  Person educated: Patient Education method: Explanation, Demonstration, Tactile cues, Verbal cues, and Handouts Education comprehension: verbalized understanding, returned demonstration, verbal cues required, tactile cues required, and needs further education  HOME EXERCISE PROGRAM: Access Code: [email or web address] URL: [email or web address] Date: 10/06/2023 Prepared by: Mose Minerva  Exercises - Bent Knee Fallouts  - 1 x daily - 7 x weekly - 2 sets - 10 reps - Supine Bridge  - 1 x daily - 7 x weekly - 2 sets - 10 reps  ASSESSMENT:  CLINICAL IMPRESSION: Patient is a 73 y.o. male with a dx of R hip pain presenting to the clinic with bilat hip OA, R > L.  Pt has bilat hip pain though worse on R.  He states his L hip primarily hurts if he missteps or jars it.  Pt reports significant pain relief from Meloxicam .  Pt has difficulty with lifting R LE and performing transfers and also bending to don shoes/socks.  He has increased pain with prolonged standing and sitting.  Pt has difficulty descending stairs and has increased pain if missteps when descending a step or curb.  Pt states he has disturbed sleep most nights.  Pt does a lot of outdoor work and has difficulty with uneven terrain.  Pt has limited hip ROM bilat and is unable to place either heel on contralateral knee in hooklying.  Pt should benefit from skilled PT to address impairments and improve overall function.     OBJECTIVE IMPAIRMENTS: Abnormal gait, decreased activity tolerance, decreased balance, decreased mobility, difficulty walking, decreased ROM, hypomobility, impaired flexibility, and pain.   ACTIVITY LIMITATIONS: bending, sitting, standing, sleeping, stairs, transfers, dressing, and locomotion level  PARTICIPATION LIMITATIONS: community activity and yard  work  PERSONAL FACTORS: 1 comorbidity: L hip OA are also affecting patient's functional outcome.   REHAB POTENTIAL: Good  CLINICAL DECISION MAKING: Stable/uncomplicated  EVALUATION COMPLEXITY: Low   GOALS:   SHORT TERM GOALS: Target date:  10/27/2023  Pt will be independent and compliant with HEP for improved pain, ROM, strength, and function.  Baseline: Goal status: INITIAL  2.  Pt will report at least a 25% improvement in pain and sx's overall.  Baseline:  Goal status: INITIAL  3.  Pt will demo at least an 8 deg increase in bilat hip ER AROM for improved mobility and stiffness.  Baseline:  Goal status: INITIAL    LONG TERM GOALS: Target date: 11/17/2023  Pt will report he is able to sleep at least 5/7 nights per week without disturbed sleep.   Baseline:  Goal status: INITIAL  2.  Pt will report he is able to lift R LE without significant difficulty and perform transfers without difficulty.  Baseline:  Goal status: INITIAL  3.  Pt will report at least a 70% improvement in pain with his daily mobility.  Baseline:  Goal status: INITIAL  4.  Pt will report he is able to perform his normal ambulation and outdoor work without significant pain.  Baseline:  Goal status: INITIAL    PLAN:  PT FREQUENCY: 1-2x/week  PT DURATION: 6 weeks  PLANNED INTERVENTIONS: 97164- PT Re-evaluation, 97750- Physical Performance Testing, 97110-Therapeutic exercises, 97530- Therapeutic activity, [id number]- Neuromuscular re-education, 97535- Self Care, 02859- Manual therapy, [id number]- Gait training, [phone]- Aquatic Therapy, [phone]- Electrical stimulation (unattended), [phone]- Electrical stimulation (manual), [id number]- Ultrasound, 79439 (1-2 muscles), 20561 (3+ muscles)- Dry Needling, Patient/Family education, Balance training, Stair training, Taping, Cryotherapy, and Moist heat  PLAN FOR NEXT  SESSION: assess HS and hip flexor tightness.  Review and perform HEP.  Strengthening, flexibility, and ROM.     Leigh Minerva III PT, DPT 10/07/23 11:36 AM   Date of referral: PT order 08/23/2023      Referring provider: de [place], Quintin PARAS, MD Referring diagnosis? M25.551 (ICD-10-CM) - Right hip pain Treatment diagnosis? (if different than referring diagnosis)  Pain in right hip  Stiffness of right hip, not elsewhere classified  What was this (referring dx) caused by? Fall and Arthritis  Nature of Condition: Chronic (continuous duration > 3 months)   Laterality: Rt  Current Functional Measure Score: LEFS: 49/80  Objective measurements identify impairments when they are compared to normal values, the uninvolved extremity, and prior level of function.  [x]  Yes  []  No  Objective assessment of functional ability: Moderate functional limitations   Briefly describe symptoms: R > L hip pain.  Limited ROM. Difficulty with functional mobility including stairs, transfers, and ambulation.   How did symptoms start: Pt fell twice.  He has arthritis in bilat hips, R > L.    Average pain intensity:  NPRS:  0/10 current, 5/10 worst in R hip and 3-4/10 in L  How often does the pt experience symptoms? Constantly  How much have the symptoms interfered with usual daily activities? Extremely  How has condition changed since care began at this facility? NA - initial visit  In general, how is the patients overall health? Very Good   BACK PAIN (STarT Back Screening Tool) No

## 2023-10-07 ENCOUNTER — Encounter (HOSPITAL_BASED_OUTPATIENT_CLINIC_OR_DEPARTMENT_OTHER): Payer: Self-pay | Admitting: Physical Therapy

## 2023-10-13 ENCOUNTER — Ambulatory Visit (HOSPITAL_BASED_OUTPATIENT_CLINIC_OR_DEPARTMENT_OTHER)

## 2023-10-13 ENCOUNTER — Encounter (HOSPITAL_BASED_OUTPATIENT_CLINIC_OR_DEPARTMENT_OTHER): Payer: Self-pay

## 2023-10-13 DIAGNOSIS — M25551 Pain in right hip: Secondary | ICD-10-CM | POA: Diagnosis not present

## 2023-10-13 DIAGNOSIS — M25651 Stiffness of right hip, not elsewhere classified: Secondary | ICD-10-CM

## 2023-10-13 NOTE — Therapy (Addendum)
 OUTPATIENT PHYSICAL THERAPY LOWER EXTREMITY TREATMENT   Patient Name: Kenneth Cantu MRN: [id number] DOB:1950-08-28, 73 y.o., male Today's Date: 10/13/2023  END OF SESSION:  PT End of Session - 10/13/23 1410     Visit Number 2    Number of Visits 10    Date for Recertification  11/17/23    Authorization Type [employer] MCR    PT Start Time 1347    PT Stop Time 1430    PT Time Calculation (min) 43 min    Activity Tolerance Patient tolerated treatment well    Behavior During Therapy [facility] for tasks assessed/performed           Past Medical History:  Diagnosis Date   Labile hypertension    Mixed hyperlipidemia    Prediabetes 06/11/2014   Testosterone  deficiency 06/11/2014   Vitamin D  deficiency    Past Surgical History:  Procedure Laterality Date   APPENDECTOMY  1981   ORIF CLAVICLE FRACTURE Left 1970s   REFRACTIVE SURGERY Bilateral 2011   WRIST FRACTURE SURGERY Left 1970's   Patient Active Problem List   Diagnosis Date Noted   Wellness examination 08/20/2023   Elevated PSA 08/20/2023   Right hip pain 05/17/2023   Prediabetes 05/17/2023   Abnormal glucose (hx of prediabetes)  12/10/2017   TMJ (temporomandibular joint syndrome) 12/10/2017   Obesity (BMI 30.0-34.9) 06/08/2013   Hyperlipidemia, mixed    Labile hypertension    Vitamin D  deficiency     PCP: de [place], Quintin PARAS, MD  REFERRING PROVIDER: de [place], Raymond J, MD  REFERRING DIAG: [phone] (ICD-10-CM) - Right hip pain   THERAPY DIAG:  Pain in right hip  Stiffness of right hip, not elsewhere classified  Rationale for Evaluation and Treatment: Rehabilitation  ONSET DATE: PT order 08/23/2023  SUBJECTIVE:   SUBJECTIVE STATEMENT:  Pt reports HEP is going well aside from hamstring cramping.   Eval: Pt began having problems in his knees which he thinks contributed to his pain.  Several years later, he slipped on ice and fell onto his R hip.  He began having more of a constant pain after the fall.  Pt reports  his pain did improve though he fell again 1 year later when slipping on ice.  His pain worsened after the after the 2nd fall and he had continued pain.  Pt has had no PT for hip pain.      Pt saw PCP who ordered x rays which showed OA. MD prescribed Meloxicam .  Pt reports an 85% improvement in pain with Meloxicam .  Pt states some days he can't lift leg to step over objects including to get into bathtub.  At times, he has difficulty bending to don shoes and socks.  He has a lot of pain if he has a misstep including stepping off a curb or step.  Pt states he has disturbed sleep most nights.  He has increased pain with prolonged standing and sitting.  Pt reports he doesn't have a normal gait and states his feet can drag some.  He has more difficulty descending stairs than ascending stairs.  Pt does a lot of outdoor work including planting.  PERTINENT HISTORY: X rays showed hip OA and decreased bone mineralization. L hip OA and pain L knee arthroscopic surgery L ORIF clavicle fx and wrist fx surgery in 1970's    PAIN:  NPRS:  0/10 current, 5/10 worst in R hip and 3-4/10 in L Location:  R > L anterior hip L hip doesn't bother  him too much unless he missteps or jars it.   PRECAUTIONS: Other: decreased bone mineralization    WEIGHT BEARING RESTRICTIONS: No  FALLS:  Has patient fallen in last 6 months? Yes, once to dodge farm equipment  LIVING ENVIRONMENT: Lives with: significant other Lives in: 3 story home Stairs: yes with rail Has following equipment at home: cane and walking sticks  OCCUPATION: Retired  PLOF: Independent  PATIENT GOALS: to be pain free, to be able to walk outdoors   OBJECTIVE:  Note: Objective measures were completed at Evaluation unless otherwise noted.  DIAGNOSTIC FINDINGS: X rays of bilat hips on 05/22/23:   FINDINGS: There is diffuse decreased bone mineralization.   Severe right greater than left superomedial femoroacetabular joint space narrowing.  Superomedial right femoral head 9 mm subchondral cyst. Moderate right greater than left femoral head-neck junction circumferential degenerative osteophytosis. Mild pubic symphysis and bilateral inferior sacroiliac joint space narrowing and subchondral sclerosis.   No acute fracture or dislocation.   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  PATIENT SURVEYS:  LEFS: 49/80  COGNITION: Overall cognitive status: Within functional limits for tasks assessed        PALPATION: No tenderness with palpation t/o R hip  LOWER EXTREMITY ROM:  Active ROM Right eval Left eval  Hip flexion 88 98  Hip extension    Hip abduction    Hip adduction    Hip internal rotation 8 15  Hip external rotation 25 22  Knee flexion    Knee extension    Ankle dorsiflexion    Ankle plantarflexion    Ankle inversion    Ankle eversion     (Blank rows = not tested)   Pt unable to place either heel on contralateral knee in hooklying.   LOWER EXTREMITY MMT:  MMT Right eval Left eval  Hip flexion 5/5 5/5  Hip extension 5/5 5/5  Hip abduction 5/5 5/5  Hip adduction    Hip internal rotation    Hip external rotation 5/5 w/n available ROM 5/5 w/n available ROM  Knee flexion 5/5 seated 5/5 seated  Knee extension 5/5 5/5  Ankle dorsiflexion    Ankle plantarflexion    Ankle inversion    Ankle eversion     (Blank rows = not tested)  LOWER EXTREMITY SPECIAL TESTS:  Scour's Test:  negative bilat FABER's Test: no pain though very limited with ROM bilat   GAIT: Assistive device utilized: None Level of assistance: Complete Independence Comments: decreased hip mobility, slow gait speed.                                                                                                                                TREATMENT:    10/8: PROM R hip (guarding present) Manual HSS Hooklying adductor squeeze 5 x10 S/l clamshells 2x10 Sit to stands from  slightly elevated plinth 2x5 Sci fit bike  L3 HEP update/review  Eval: Pt performed S/L clams, supine bent knee fall outs, and supine bridging.  Pt received a HEP handout and was educated in correct form and appropriate frequency.  PT instructed pt he shouldn't have pain with HEP.    PATIENT EDUCATION:  Education details: relevant anatomy, objective findings, POC, dx, HEP, rationale of interventions.  PT answered pt's questions.  Person educated: Patient Education method: Explanation, Demonstration, Tactile cues, Verbal cues, and Handouts Education comprehension: verbalized understanding, returned demonstration, verbal cues required, tactile cues required, and needs further education  HOME EXERCISE PROGRAM: Access Code: [email or web address] URL: [email or web address] Date: 10/06/2023 Prepared by: Mose Minerva  Exercises - Bent Knee Fallouts  - 1 x daily - 7 x weekly - 2 sets - 10 reps - Supine Bridge  - 1 x daily - 7 x weekly - 2 sets - 10 reps  ASSESSMENT:  CLINICAL IMPRESSION:  Pt tight into all planes with R hip PROM with some guarding present. He is prone to HS cramps, so monitored throughout session. Performed well with s/l clamshells for lateral hip strengthening. Added HS stretching to HEP with cues to avoid pushing through discomfort or cramping. Initiated leg bike today with good tolerance. Pt reports he just purchased an upright bike at home. He can try this for short duration to see if well tolerated and then slowly increase.   IE: Patient is a 73 y.o. male with a dx of R hip pain presenting to the clinic with bilat hip OA, R > L.  Pt has bilat hip pain though worse on R.  He states his L hip primarily hurts if he missteps or jars it.  Pt reports significant pain relief from Meloxicam .  Pt has difficulty with lifting R LE and performing transfers and also bending to don shoes/socks.  He has increased pain with prolonged standing and sitting.  Pt has difficulty  descending stairs and has increased pain if missteps when descending a step or curb.  Pt states he has disturbed sleep most nights.  Pt does a lot of outdoor work and has difficulty with uneven terrain.  Pt has limited hip ROM bilat and is unable to place either heel on contralateral knee in hooklying.  Pt should benefit from skilled PT to address impairments and improve overall function.     OBJECTIVE IMPAIRMENTS: Abnormal gait, decreased activity tolerance, decreased balance, decreased mobility, difficulty walking, decreased ROM, hypomobility, impaired flexibility, and pain.   ACTIVITY LIMITATIONS: bending, sitting, standing, sleeping, stairs, transfers, dressing, and locomotion level  PARTICIPATION LIMITATIONS: community activity and yard work  PERSONAL FACTORS: 1 comorbidity: L hip OA are also affecting patient's functional outcome.   REHAB POTENTIAL: Good  CLINICAL DECISION MAKING: Stable/uncomplicated  EVALUATION COMPLEXITY: Low   GOALS:   SHORT TERM GOALS: Target date:  10/27/2023  Pt will be independent and compliant with HEP for improved pain, ROM, strength, and function.  Baseline: Goal status: INITIAL  2.  Pt will report at least a 25% improvement in pain and sx's overall.  Baseline:  Goal status: INITIAL  3.  Pt will demo at least an 8 deg increase in bilat hip ER AROM for improved mobility and stiffness.  Baseline:  Goal status: INITIAL    LONG TERM GOALS: Target date: 11/17/2023  Pt will report he is able to sleep at least 5/7 nights per week without disturbed sleep.   Baseline:  Goal status: INITIAL  2.  Pt will report he is able to lift R LE without significant difficulty and perform  transfers without difficulty.  Baseline:  Goal status: INITIAL  3.  Pt will report at least a 70% improvement in pain with his daily mobility.  Baseline:  Goal status: INITIAL  4.  Pt will report he is able to perform his normal ambulation and outdoor work without  significant pain.  Baseline:  Goal status: INITIAL    PLAN:  PT FREQUENCY: 1-2x/week  PT DURATION: 6 weeks  PLANNED INTERVENTIONS: 97164- PT Re-evaluation, 97750- Physical Performance Testing, 97110-Therapeutic exercises, 97530- Therapeutic activity, [id number]- Neuromuscular re-education, 97535- Self Care, 02859- Manual therapy, [id number]- Gait training, [phone]- Aquatic Therapy, [phone]- Electrical stimulation (unattended), [phone]- Electrical stimulation (manual), [id number]- Ultrasound, 79439 (1-2 muscles), 20561 (3+ muscles)- Dry Needling, Patient/Family education, Balance training, Stair training, Taping, Cryotherapy, and Moist heat  PLAN FOR NEXT SESSION: assess HS and hip flexor tightness.  Review and perform HEP.  Strengthening, flexibility, and ROM.    Asberry Rodes, PTA  10/13/23 5:38 PM   Date of referral: PT order 08/23/2023      Referring provider: de [place], Quintin PARAS, MD Referring diagnosis? M25.551 (ICD-10-CM) - Right hip pain Treatment diagnosis? (if different than referring diagnosis)  Pain in right hip  Stiffness of right hip, not elsewhere classified  What was this (referring dx) caused by? Fall and Arthritis  Nature of Condition: Chronic (continuous duration > 3 months)   Laterality: Rt  Current Functional Measure Score: LEFS: 49/80  Objective measurements identify impairments when they are compared to normal values, the uninvolved extremity, and prior level of function.  [x]  Yes  []  No  Objective assessment of functional ability: Moderate functional limitations   Briefly describe symptoms: R > L hip pain.  Limited ROM. Difficulty with functional mobility including stairs, transfers, and ambulation.   How did symptoms start: Pt fell twice.  He has arthritis in bilat hips, R > L.    Average pain intensity:  NPRS:  0/10 current, 5/10 worst in R hip and 3-4/10 in L  How often does the pt experience symptoms? Constantly  How much have the symptoms interfered with  usual daily activities? Extremely  How has condition changed since care began at this facility? NA - initial visit  In general, how is the patients overall health? Very Good   BACK PAIN (STarT Back Screening Tool) No

## 2023-10-18 ENCOUNTER — Other Ambulatory Visit (HOSPITAL_BASED_OUTPATIENT_CLINIC_OR_DEPARTMENT_OTHER): Payer: Self-pay | Admitting: Family Medicine

## 2023-10-20 ENCOUNTER — Encounter (HOSPITAL_BASED_OUTPATIENT_CLINIC_OR_DEPARTMENT_OTHER): Payer: Self-pay | Admitting: Physical Therapy

## 2023-10-20 ENCOUNTER — Ambulatory Visit (HOSPITAL_BASED_OUTPATIENT_CLINIC_OR_DEPARTMENT_OTHER): Admitting: Physical Therapy

## 2023-10-20 DIAGNOSIS — M25551 Pain in right hip: Secondary | ICD-10-CM

## 2023-10-20 DIAGNOSIS — M25651 Stiffness of right hip, not elsewhere classified: Secondary | ICD-10-CM

## 2023-10-20 NOTE — Therapy (Signed)
 OUTPATIENT PHYSICAL THERAPY LOWER EXTREMITY TREATMENT   Patient Name: HALLEY KINCER MRN: [id number] DOB:02-Mar-1950, 73 y.o., male Today's Date: 10/20/2023  END OF SESSION:  PT End of Session - 10/20/23 0907     Visit Number 3    Number of Visits 10    Date for Recertification  11/17/23    Authorization Type [employer] MCR    PT Start Time 0847    PT Stop Time 0927    PT Time Calculation (min) 40 min    Activity Tolerance Patient tolerated treatment well    Behavior During Therapy [facility] for tasks assessed/performed            Past Medical History:  Diagnosis Date   Labile hypertension    Mixed hyperlipidemia    Prediabetes 06/11/2014   Testosterone  deficiency 06/11/2014   Vitamin D  deficiency    Past Surgical History:  Procedure Laterality Date   APPENDECTOMY  1981   ORIF CLAVICLE FRACTURE Left 1970s   REFRACTIVE SURGERY Bilateral 2011   WRIST FRACTURE SURGERY Left 1970's   Patient Active Problem List   Diagnosis Date Noted   Wellness examination 08/20/2023   Elevated PSA 08/20/2023   Right hip pain 05/17/2023   Prediabetes 05/17/2023   Abnormal glucose (hx of prediabetes)  12/10/2017   TMJ (temporomandibular joint syndrome) 12/10/2017   Obesity (BMI 30.0-34.9) 06/08/2013   Hyperlipidemia, mixed    Labile hypertension    Vitamin D  deficiency     PCP: de [place], Quintin PARAS, MD  REFERRING PROVIDER: de [place], Raymond J, MD  REFERRING DIAG: [phone] (ICD-10-CM) - Right hip pain   THERAPY DIAG:  Pain in right hip  Stiffness of right hip, not elsewhere classified  Rationale for Evaluation and Treatment: Rehabilitation  ONSET DATE: PT order 08/23/2023  SUBJECTIVE:   SUBJECTIVE STATEMENT:  Pt reports hip is slightly better but is still stiff first thing in the morning. Pt reports no issues with HEP.   Eval: Pt began having problems in his knees which he thinks contributed to his pain.  Several years later, he slipped on ice and fell onto his R hip.  He began  having more of a constant pain after the fall.  Pt reports his pain did improve though he fell again 1 year later when slipping on ice.  His pain worsened after the after the 2nd fall and he had continued pain.  Pt has had no PT for hip pain.      Pt saw PCP who ordered x rays which showed OA. MD prescribed Meloxicam .  Pt reports an 85% improvement in pain with Meloxicam .  Pt states some days he can't lift leg to step over objects including to get into bathtub.  At times, he has difficulty bending to don shoes and socks.  He has a lot of pain if he has a misstep including stepping off a curb or step.  Pt states he has disturbed sleep most nights.  He has increased pain with prolonged standing and sitting.  Pt reports he doesn't have a normal gait and states his feet can drag some.  He has more difficulty descending stairs than ascending stairs.  Pt does a lot of outdoor work including planting.  PERTINENT HISTORY: X rays showed hip OA and decreased bone mineralization. L hip OA and pain L knee arthroscopic surgery L ORIF clavicle fx and wrist fx surgery in 1970's    PAIN:  NPRS:  0/10 current, 5/10 worst in R hip and 3-4/10 in  L Location:  R > L anterior hip L hip doesn't bother him too much unless he missteps or jars it.   PRECAUTIONS: Other: decreased bone mineralization    WEIGHT BEARING RESTRICTIONS: No  FALLS:  Has patient fallen in last 6 months? Yes, once to dodge farm equipment  LIVING ENVIRONMENT: Lives with: significant other Lives in: 3 story home Stairs: yes with rail Has following equipment at home: cane and walking sticks  OCCUPATION: Retired  PLOF: Independent  PATIENT GOALS: to be pain free, to be able to walk outdoors   OBJECTIVE:  Note: Objective measures were completed at Evaluation unless otherwise noted.  DIAGNOSTIC FINDINGS: X rays of bilat hips on 05/22/23:   FINDINGS: There is diffuse decreased bone mineralization.   Severe right greater than  left superomedial femoroacetabular joint space narrowing. Superomedial right femoral head 9 mm subchondral cyst. Moderate right greater than left femoral head-neck junction circumferential degenerative osteophytosis. Mild pubic symphysis and bilateral inferior sacroiliac joint space narrowing and subchondral sclerosis.   No acute fracture or dislocation.   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  PATIENT SURVEYS:  LEFS: 49/80  COGNITION: Overall cognitive status: Within functional limits for tasks assessed        PALPATION: No tenderness with palpation t/o R hip  LOWER EXTREMITY ROM:  Active ROM Right eval Left eval  Hip flexion 88 98  Hip extension    Hip abduction    Hip adduction    Hip internal rotation 8 15  Hip external rotation 25 22  Knee flexion    Knee extension    Ankle dorsiflexion    Ankle plantarflexion    Ankle inversion    Ankle eversion     (Blank rows = not tested)   Pt unable to place either heel on contralateral knee in hooklying.   LOWER EXTREMITY MMT:  MMT Right eval Left eval  Hip flexion 5/5 5/5  Hip extension 5/5 5/5  Hip abduction 5/5 5/5  Hip adduction    Hip internal rotation    Hip external rotation 5/5 w/n available ROM 5/5 w/n available ROM  Knee flexion 5/5 seated 5/5 seated  Knee extension 5/5 5/5  Ankle dorsiflexion    Ankle plantarflexion    Ankle inversion    Ankle eversion     (Blank rows = not tested)  LOWER EXTREMITY SPECIAL TESTS:  Scour's Test:  negative bilat FABER's Test: no pain though very limited with ROM bilat   GAIT: Assistive device utilized: None Level of assistance: Complete Independence Comments: decreased hip mobility, slow gait speed.                                                                                                                                TREATMENT:      10/15  Mulligan R hip inferior and lateral mob grade  III Supine thomas stretch 30s 3x Prone quad stretch 30s 3x  Seated pirifromis stretch 30s 3x Standing hip flexor stretch (attempted) STS from knee height table 3x8  Edu of imaging, effects of OA on hip joint, self pain management, thermo therapy, conservative management vs THA  10/8: PROM R hip (guarding present) Manual HSS Hooklying adductor squeeze 5 x10 S/l clamshells 2x10 Sit to stands from slightly elevated plinth 2x5 Sci fit bike  L3 HEP update/review    Eval: Pt performed S/L clams, supine bent knee fall outs, and supine bridging.  Pt received a HEP handout and was educated in correct form and appropriate frequency.  PT instructed pt he shouldn't have pain with HEP.    PATIENT EDUCATION:  Education details: relevant anatomy, objective findings, POC, dx, HEP, rationale of interventions.  PT answered pt's questions.  Person educated: Patient Education method: Explanation, Demonstration, Tactile cues, Verbal cues, and Handouts Education comprehension: verbalized understanding, returned demonstration, verbal cues required, tactile cues required, and needs further education  HOME EXERCISE PROGRAM: Access Code: [email or web address] URL: [email or web address] Date: 10/06/2023 Prepared by: Mose Minerva  Exercises - Bent Knee Fallouts  - 1 x daily - 7 x weekly - 2 sets - 10 reps - Supine Bridge  - 1 x daily - 7 x weekly - 2 sets - 10 reps  ASSESSMENT:  CLINICAL IMPRESSION:  Pt continues to have joint stiffness in all planes with extension being most limited. However, reports improvement in discomfort with exercise and focus on hip ROM and mobility. No pain or complaints of hip irritation during session. HEP updated accorindgly. Plan to continue with progressive hip mobility in the setting of joint stiffness that is likely OA related.   IE: Patient is a 73 y.o. male with a dx of R hip pain presenting to the clinic with bilat hip OA, R > L.  Pt has bilat hip pain  though worse on R.  He states his L hip primarily hurts if he missteps or jars it.  Pt reports significant pain relief from Meloxicam .  Pt has difficulty with lifting R LE and performing transfers and also bending to don shoes/socks.  He has increased pain with prolonged standing and sitting.  Pt has difficulty descending stairs and has increased pain if missteps when descending a step or curb.  Pt states he has disturbed sleep most nights.  Pt does a lot of outdoor work and has difficulty with uneven terrain.  Pt has limited hip ROM bilat and is unable to place either heel on contralateral knee in hooklying.  Pt should benefit from skilled PT to address impairments and improve overall function.     OBJECTIVE IMPAIRMENTS: Abnormal gait, decreased activity tolerance, decreased balance, decreased mobility, difficulty walking, decreased ROM, hypomobility, impaired flexibility, and pain.   ACTIVITY LIMITATIONS: bending, sitting, standing, sleeping, stairs, transfers, dressing, and locomotion level  PARTICIPATION LIMITATIONS: community activity and yard work  PERSONAL FACTORS: 1 comorbidity: L hip OA are also affecting patient's functional outcome.   REHAB POTENTIAL: Good  CLINICAL DECISION MAKING: Stable/uncomplicated  EVALUATION COMPLEXITY: Low   GOALS:   SHORT TERM GOALS: Target date:  10/27/2023  Pt will be independent and compliant with HEP for improved pain, ROM, strength, and function.  Baseline: Goal status: INITIAL  2.  Pt will report at least a 25% improvement in pain and sx's overall.  Baseline:  Goal status: INITIAL  3.  Pt will demo at least an 8 deg increase in bilat hip ER AROM for improved mobility and stiffness.  Baseline:  Goal status: INITIAL  LONG TERM GOALS: Target date: 11/17/2023  Pt will report he is able to sleep at least 5/7 nights per week without disturbed sleep.   Baseline:  Goal status: INITIAL  2.  Pt will report he is able to lift R LE without  significant difficulty and perform transfers without difficulty.  Baseline:  Goal status: INITIAL  3.  Pt will report at least a 70% improvement in pain with his daily mobility.  Baseline:  Goal status: INITIAL  4.  Pt will report he is able to perform his normal ambulation and outdoor work without significant pain.  Baseline:  Goal status: INITIAL    PLAN:  PT FREQUENCY: 1-2x/week  PT DURATION: 6 weeks  PLANNED INTERVENTIONS: 97164- PT Re-evaluation, 97750- Physical Performance Testing, 97110-Therapeutic exercises, 97530- Therapeutic activity, [id number]- Neuromuscular re-education, 97535- Self Care, [id number]- Manual therapy, [id number]- Gait training, [phone]- Aquatic Therapy, [phone]- Electrical stimulation (unattended), [phone]- Electrical stimulation (manual), [id number]- Ultrasound, 79439 (1-2 muscles), 20561 (3+ muscles)- Dry Needling, Patient/Family education, Balance training, Stair training, Taping, Cryotherapy, and Moist heat  PLAN FOR NEXT SESSION: assess HS and hip flexor tightness.  Review and perform HEP.  Strengthening, flexibility, and ROM.    Asberry Rodes, PTA  10/20/23 9:34 AM   Date of referral: PT order 08/23/2023      Referring provider: de [place], Quintin PARAS, MD Referring diagnosis? M25.551 (ICD-10-CM) - Right hip pain Treatment diagnosis? (if different than referring diagnosis)  Pain in right hip  Stiffness of right hip, not elsewhere classified  What was this (referring dx) caused by? Fall and Arthritis  Nature of Condition: Chronic (continuous duration > 3 months)   Laterality: Rt  Current Functional Measure Score: LEFS: 49/80  Objective measurements identify impairments when they are compared to normal values, the uninvolved extremity, and prior level of function.  [x]  Yes  []  No  Objective assessment of functional ability: Moderate functional limitations   Briefly describe symptoms: R > L hip pain.  Limited ROM. Difficulty with functional mobility including  stairs, transfers, and ambulation.   How did symptoms start: Pt fell twice.  He has arthritis in bilat hips, R > L.    Average pain intensity:  NPRS:  0/10 current, 5/10 worst in R hip and 3-4/10 in L  How often does the pt experience symptoms? Constantly  How much have the symptoms interfered with usual daily activities? Extremely  How has condition changed since care began at this facility? NA - initial visit  In general, how is the patients overall health? Very Good   BACK PAIN (STarT Back Screening Tool) No

## 2023-10-27 ENCOUNTER — Encounter (HOSPITAL_BASED_OUTPATIENT_CLINIC_OR_DEPARTMENT_OTHER): Payer: Self-pay | Admitting: Physical Therapy

## 2023-10-27 ENCOUNTER — Ambulatory Visit (HOSPITAL_BASED_OUTPATIENT_CLINIC_OR_DEPARTMENT_OTHER): Payer: Self-pay | Admitting: Physical Therapy

## 2023-10-27 DIAGNOSIS — M25551 Pain in right hip: Secondary | ICD-10-CM | POA: Diagnosis not present

## 2023-10-27 DIAGNOSIS — M25651 Stiffness of right hip, not elsewhere classified: Secondary | ICD-10-CM

## 2023-10-27 NOTE — Therapy (Signed)
 OUTPATIENT PHYSICAL THERAPY LOWER EXTREMITY TREATMENT   Patient Name: CLAIBORNE STROBLE MRN: [id number] DOB:29-Mar-1950, 73 y.o., male Today's Date: 10/27/2023  END OF SESSION:  PT End of Session - 10/27/23 1644     Visit Number 4    Number of Visits 10    Date for Recertification  11/17/23    Authorization Type [employer] MCR    PT Start Time 1615    PT Stop Time 1655    PT Time Calculation (min) 40 min    Activity Tolerance Patient tolerated treatment well    Behavior During Therapy [facility] for tasks assessed/performed             Past Medical History:  Diagnosis Date   Labile hypertension    Mixed hyperlipidemia    Prediabetes 06/11/2014   Testosterone  deficiency 06/11/2014   Vitamin D  deficiency    Past Surgical History:  Procedure Laterality Date   APPENDECTOMY  1981   ORIF CLAVICLE FRACTURE Left 1970s   REFRACTIVE SURGERY Bilateral 2011   WRIST FRACTURE SURGERY Left 1970's   Patient Active Problem List   Diagnosis Date Noted   Wellness examination 08/20/2023   Elevated PSA 08/20/2023   Right hip pain 05/17/2023   Prediabetes 05/17/2023   Abnormal glucose (hx of prediabetes)  12/10/2017   TMJ (temporomandibular joint syndrome) 12/10/2017   Obesity (BMI 30.0-34.9) 06/08/2013   Hyperlipidemia, mixed    Labile hypertension    Vitamin D  deficiency     PCP: de [place], Quintin PARAS, MD  REFERRING PROVIDER: de [place], Raymond J, MD  REFERRING DIAG: [phone] (ICD-10-CM) - Right hip pain   THERAPY DIAG:  Pain in right hip  Stiffness of right hip, not elsewhere classified  Rationale for Evaluation and Treatment: Rehabilitation  ONSET DATE: PT order 08/23/2023  SUBJECTIVE:   SUBJECTIVE STATEMENT:  Pt reports the hip continues to be bettter. Going down steps is no longer painful. Pt reports the leg not feeling like it's dragging quite as much.   Eval: Pt began having problems in his knees which he thinks contributed to his pain.  Several years later, he slipped on ice  and fell onto his R hip.  He began having more of a constant pain after the fall.  Pt reports his pain did improve though he fell again 1 year later when slipping on ice.  His pain worsened after the after the 2nd fall and he had continued pain.  Pt has had no PT for hip pain.      Pt saw PCP who ordered x rays which showed OA. MD prescribed Meloxicam .  Pt reports an 85% improvement in pain with Meloxicam .  Pt states some days he can't lift leg to step over objects including to get into bathtub.  At times, he has difficulty bending to don shoes and socks.  He has a lot of pain if he has a misstep including stepping off a curb or step.  Pt states he has disturbed sleep most nights.  He has increased pain with prolonged standing and sitting.  Pt reports he doesn't have a normal gait and states his feet can drag some.  He has more difficulty descending stairs than ascending stairs.  Pt does a lot of outdoor work including planting.  PERTINENT HISTORY: X rays showed hip OA and decreased bone mineralization. L hip OA and pain L knee arthroscopic surgery L ORIF clavicle fx and wrist fx surgery in 1970's    PAIN:  NPRS:  0/10 current, 5/10  worst in R hip and 3-4/10 in L Location:  R > L anterior hip L hip doesn't bother him too much unless he missteps or jars it.   PRECAUTIONS: Other: decreased bone mineralization    WEIGHT BEARING RESTRICTIONS: No  FALLS:  Has patient fallen in last 6 months? Yes, once to dodge farm equipment  LIVING ENVIRONMENT: Lives with: significant other Lives in: 3 story home Stairs: yes with rail Has following equipment at home: cane and walking sticks  OCCUPATION: Retired  PLOF: Independent  PATIENT GOALS: to be pain free, to be able to walk outdoors   OBJECTIVE:  Note: Objective measures were completed at Evaluation unless otherwise noted.  DIAGNOSTIC FINDINGS: X rays of bilat hips on 05/22/23:   FINDINGS: There is diffuse decreased bone  mineralization.   Severe right greater than left superomedial femoroacetabular joint space narrowing. Superomedial right femoral head 9 mm subchondral cyst. Moderate right greater than left femoral head-neck junction circumferential degenerative osteophytosis. Mild pubic symphysis and bilateral inferior sacroiliac joint space narrowing and subchondral sclerosis.   No acute fracture or dislocation.   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  PATIENT SURVEYS:  LEFS: 49/80  COGNITION: Overall cognitive status: Within functional limits for tasks assessed        PALPATION: No tenderness with palpation t/o R hip  LOWER EXTREMITY ROM:  Active ROM Right eval Left eval  Hip flexion 88 98  Hip extension    Hip abduction    Hip adduction    Hip internal rotation 8 15  Hip external rotation 25 22  Knee flexion    Knee extension    Ankle dorsiflexion    Ankle plantarflexion    Ankle inversion    Ankle eversion     (Blank rows = not tested)   Pt unable to place either heel on contralateral knee in hooklying.   LOWER EXTREMITY MMT:  MMT Right eval Left eval  Hip flexion 5/5 5/5  Hip extension 5/5 5/5  Hip abduction 5/5 5/5  Hip adduction    Hip internal rotation    Hip external rotation 5/5 w/n available ROM 5/5 w/n available ROM  Knee flexion 5/5 seated 5/5 seated  Knee extension 5/5 5/5  Ankle dorsiflexion    Ankle plantarflexion    Ankle inversion    Ankle eversion     (Blank rows = not tested)  LOWER EXTREMITY SPECIAL TESTS:  Scour's Test:  negative bilat FABER's Test: no pain though very limited with ROM bilat   GAIT: Assistive device utilized: None Level of assistance: Complete Independence Comments: decreased hip mobility, slow gait speed.                                                                                                                                TREATMENT:    10/22  Nustep lvl  5 6 min   Mulligan R hip inferior and lateral mob  grade III, beltLAD in open pack Supine thomas stretch 30s 3x LTR 20x 2x10 step ups on both  Seated lumbar flexion stretch 10s 5x  HEP update  10/15  Mulligan R hip inferior and lateral mob grade III Supine thomas stretch 30s 3x Prone quad stretch 30s 3x Seated pirifromis stretch 30s 3x Standing hip flexor stretch (attempted) STS from knee height table 3x8  Edu of imaging, effects of OA on hip joint, self pain management, thermo therapy, conservative management vs THA  10/8: PROM R hip (guarding present) Manual HSS Hooklying adductor squeeze 5 x10 S/l clamshells 2x10 Sit to stands from slightly elevated plinth 2x5 Sci fit bike  L3 HEP update/review    Eval: Pt performed S/L clams, supine bent knee fall outs, and supine bridging.  Pt received a HEP handout and was educated in correct form and appropriate frequency.  PT instructed pt he shouldn't have pain with HEP.    PATIENT EDUCATION:  Education details: relevant anatomy, objective findings, POC, dx, HEP, rationale of interventions.  PT answered pt's questions.  Person educated: Patient Education method: Explanation, Demonstration, Tactile cues, Verbal cues, and Handouts Education comprehension: verbalized understanding, returned demonstration, verbal cues required, tactile cues required, and needs further education  HOME EXERCISE PROGRAM: Access Code: [email or web address] URL: [email or web address] Date: 10/06/2023 Prepared by: Mose Minerva  Exercises - Bent Knee Fallouts  - 1 x daily - 7 x weekly - 2 sets - 10 reps - Supine Bridge  - 1 x daily - 7 x weekly - 2 sets - 10 reps  ASSESSMENT:  CLINICAL IMPRESSION: Pt pain has improved with functional mobility since prior sessions with report of imrovement with pain doing stairs. Pt is still hip joint stiffness limited but has gained ROM since prior session. Flexion and ER are still most difficult but  non-painful during stretching exercise. HEP updated today for progressive mobility and SL stability exercise. Plan to continue with progressive hip mobility in the setting of joint stiffness that is likely OA related.   IE: Patient is a 73 y.o. male with a dx of R hip pain presenting to the clinic with bilat hip OA, R > L.  Pt has bilat hip pain though worse on R.  He states his L hip primarily hurts if he missteps or jars it.  Pt reports significant pain relief from Meloxicam .  Pt has difficulty with lifting R LE and performing transfers and also bending to don shoes/socks.  He has increased pain with prolonged standing and sitting.  Pt has difficulty descending stairs and has increased pain if missteps when descending a step or curb.  Pt states he has disturbed sleep most nights.  Pt does a lot of outdoor work and has difficulty with uneven terrain.  Pt has limited hip ROM bilat and is unable to place either heel on contralateral knee in hooklying.  Pt should benefit from skilled PT to address impairments and improve overall function.     OBJECTIVE IMPAIRMENTS: Abnormal gait, decreased activity tolerance, decreased balance, decreased mobility, difficulty walking, decreased ROM, hypomobility, impaired flexibility, and pain.   ACTIVITY LIMITATIONS: bending, sitting, standing, sleeping, stairs, transfers, dressing, and locomotion level  PARTICIPATION LIMITATIONS: community activity and yard work  PERSONAL FACTORS: 1 comorbidity: L hip OA are also affecting patient's functional outcome.   REHAB POTENTIAL: Good  CLINICAL DECISION MAKING: Stable/uncomplicated  EVALUATION COMPLEXITY: Low   GOALS:   SHORT TERM GOALS: Target date:  10/27/2023  Pt will be independent and compliant with HEP for  improved pain, ROM, strength, and function.  Baseline: Goal status: INITIAL  2.  Pt will report at least a 25% improvement in pain and sx's overall.  Baseline:  Goal status: INITIAL  3.  Pt will demo  at least an 8 deg increase in bilat hip ER AROM for improved mobility and stiffness.  Baseline:  Goal status: INITIAL    LONG TERM GOALS: Target date: 11/17/2023  Pt will report he is able to sleep at least 5/7 nights per week without disturbed sleep.   Baseline:  Goal status: INITIAL  2.  Pt will report he is able to lift R LE without significant difficulty and perform transfers without difficulty.  Baseline:  Goal status: INITIAL  3.  Pt will report at least a 70% improvement in pain with his daily mobility.  Baseline:  Goal status: INITIAL  4.  Pt will report he is able to perform his normal ambulation and outdoor work without significant pain.  Baseline:  Goal status: INITIAL    PLAN:  PT FREQUENCY: 1-2x/week  PT DURATION: 6 weeks  PLANNED INTERVENTIONS: 97164- PT Re-evaluation, 97750- Physical Performance Testing, 97110-Therapeutic exercises, 97530- Therapeutic activity, [id number]- Neuromuscular re-education, 97535- Self Care, 02859- Manual therapy, [id number]- Gait training, [phone]- Aquatic Therapy, [phone]- Electrical stimulation (unattended), [phone]- Electrical stimulation (manual), [id number]- Ultrasound, 79439 (1-2 muscles), 20561 (3+ muscles)- Dry Needling, Patient/Family education, Balance training, Stair training, Taping, Cryotherapy, and Moist heat  PLAN FOR NEXT SESSION: assess HS and hip flexor tightness.  Review and perform HEP.  Strengthening, flexibility, and ROM.   Dale Call PT, DPT 10/27/23 4:59 PM    Date of referral: PT order 08/23/2023      Referring provider: [place], Quintin PARAS, MD Referring diagnosis? M25.551 (ICD-10-CM) - Right hip pain Treatment diagnosis? (if different than referring diagnosis)  Pain in right hip  Stiffness of right hip, not elsewhere classified  What was this (referring dx) caused by? Fall and Arthritis  Nature of Condition: Chronic (continuous duration > 3 months)   Laterality: Rt  Current Functional Measure Score: LEFS:  49/80  Objective measurements identify impairments when they are compared to normal values, the uninvolved extremity, and prior level of function.  [x]  Yes  []  No  Objective assessment of functional ability: Moderate functional limitations   Briefly describe symptoms: R > L hip pain.  Limited ROM. Difficulty with functional mobility including stairs, transfers, and ambulation.   How did symptoms start: Pt fell twice.  He has arthritis in bilat hips, R > L.    Average pain intensity:  NPRS:  0/10 current, 5/10 worst in R hip and 3-4/10 in L  How often does the pt experience symptoms? Constantly  How much have the symptoms interfered with usual daily activities? Extremely  How has condition changed since care began at this facility? NA - initial visit  In general, how is the patients overall health? Very Good   BACK PAIN (STarT Back Screening Tool) No

## 2023-11-04 ENCOUNTER — Ambulatory Visit (HOSPITAL_BASED_OUTPATIENT_CLINIC_OR_DEPARTMENT_OTHER): Payer: Self-pay | Admitting: Physical Therapy

## 2023-11-04 ENCOUNTER — Encounter (HOSPITAL_BASED_OUTPATIENT_CLINIC_OR_DEPARTMENT_OTHER): Payer: Self-pay | Admitting: Physical Therapy

## 2023-11-04 DIAGNOSIS — M25551 Pain in right hip: Secondary | ICD-10-CM | POA: Diagnosis not present

## 2023-11-04 DIAGNOSIS — M25651 Stiffness of right hip, not elsewhere classified: Secondary | ICD-10-CM

## 2023-11-04 NOTE — Therapy (Signed)
 OUTPATIENT PHYSICAL THERAPY LOWER EXTREMITY TREATMENT   Patient Name: Kenneth Cantu MRN: [id number] DOB:[birth year], 73 y.o., male Today's Date: 11/05/2023  END OF SESSION:  PT End of Session - 11/04/23 0810     Visit Number 5    Number of Visits 10    Date for Recertification  11/17/23    Authorization Type [employer] MCR    PT Start Time 0802    PT Stop Time 0847    PT Time Calculation (min) 45 min    Activity Tolerance Patient tolerated treatment well    Behavior During Therapy [facility] for tasks assessed/performed              Past Medical History:  Diagnosis Date   Labile hypertension    Mixed hyperlipidemia    Prediabetes 06/11/2014   Testosterone  deficiency 06/11/2014   Vitamin D  deficiency    Past Surgical History:  Procedure Laterality Date   APPENDECTOMY  1981   ORIF CLAVICLE FRACTURE Left 1970s   REFRACTIVE SURGERY Bilateral 2011   WRIST FRACTURE SURGERY Left 1970's   Patient Active Problem List   Diagnosis Date Noted   Wellness examination 08/20/2023   Elevated PSA 08/20/2023   Right hip pain 05/17/2023   Prediabetes 05/17/2023   Abnormal glucose (hx of prediabetes)  12/10/2017   TMJ (temporomandibular joint syndrome) 12/10/2017   Obesity (BMI 30.0-34.9) 06/08/2013   Hyperlipidemia, mixed    Labile hypertension    Vitamin D  deficiency     PCP: [place], Quintin PARAS, MD  REFERRING PROVIDER: [place], Raymond J, MD  REFERRING DIAG: [phone] (ICD-10-CM) - Right hip pain   THERAPY DIAG:  Pain in right hip  Stiffness of right hip, not elsewhere classified  Rationale for Evaluation and Treatment: Rehabilitation  ONSET DATE: PT order 08/23/2023  SUBJECTIVE:   SUBJECTIVE STATEMENT: It's been going well, but still things I can't do.  Pt denies pain currently.  Pt denies any adverse effects after prior treatment.  Pt reports compliance with HEP.  Pt states his stiffness is improving.  Pt forgot to take meds this AM.     PERTINENT HISTORY: X rays showed  hip OA and decreased bone mineralization. L hip OA and pain L knee arthroscopic surgery L ORIF clavicle fx and wrist fx surgery in 1970's    PAIN:  NPRS:  0/10 current, 5/10 worst in R hip and 3-4/10 in L Location:  R > L anterior hip L hip doesn't bother him too much unless he missteps or jars it.   PRECAUTIONS: Other: decreased bone mineralization    WEIGHT BEARING RESTRICTIONS: No  FALLS:  Has patient fallen in last 6 months? Yes, once to dodge farm equipment  LIVING ENVIRONMENT: Lives with: significant other Lives in: 3 story home Stairs: yes with rail Has following equipment at home: cane and walking sticks  OCCUPATION: Retired  PLOF: Independent  PATIENT GOALS: to be pain free, to be able to walk outdoors   OBJECTIVE:  Note: Objective measures were completed at Evaluation unless otherwise noted.  DIAGNOSTIC FINDINGS: X rays of bilat hips on 05/22/23:   FINDINGS: There is diffuse decreased bone mineralization.   Severe right greater than left superomedial femoroacetabular joint space narrowing. Superomedial right femoral head 9 mm subchondral cyst. Moderate right greater than left femoral head-neck junction circumferential degenerative osteophytosis. Mild pubic symphysis and bilateral inferior sacroiliac joint space narrowing and subchondral sclerosis.   No acute fracture or dislocation.   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  PATIENT SURVEYS:  LEFS: 49/80  COGNITION: Overall cognitive status: Within functional limits for tasks assessed        PALPATION: No tenderness with palpation t/o R hip  LOWER EXTREMITY ROM:  Active ROM Right eval Left eval  Hip flexion 88 98  Hip extension    Hip abduction    Hip adduction    Hip internal rotation 8 15  Hip external rotation 25 22  Knee flexion    Knee extension    Ankle dorsiflexion    Ankle plantarflexion    Ankle inversion    Ankle  eversion     (Blank rows = not tested)   Pt unable to place either heel on contralateral knee in hooklying.   LOWER EXTREMITY MMT:  MMT Right eval Left eval  Hip flexion 5/5 5/5  Hip extension 5/5 5/5  Hip abduction 5/5 5/5  Hip adduction    Hip internal rotation    Hip external rotation 5/5 w/n available ROM 5/5 w/n available ROM  Knee flexion 5/5 seated 5/5 seated  Knee extension 5/5 5/5  Ankle dorsiflexion    Ankle plantarflexion    Ankle inversion    Ankle eversion     (Blank rows = not tested)  LOWER EXTREMITY SPECIAL TESTS:  Scour's Test:  negative bilat FABER's Test: no pain though very limited with ROM bilat   GAIT: Assistive device utilized: None Level of assistance: Complete Independence Comments: decreased hip mobility, slow gait speed.                                                                                                                                TREATMENT:    10/29 Nustep lvl 5 6 min Pt received R hip PROM in flexion and ER per pt and tissue tolerance.  Supine bridge 2x10 Supine lumbar rotation 2x10 Supine modified thomas stretch 3x20 sec bilat Supine HS stretch with strap 2x30 sec bilat S/L clams 2x10 bilat Sit to stands from table x10 Step ups 6 inch x 10 bilat   10/22  Nustep lvl 5 6 min   Mulligan R hip inferior and lateral mob grade III, beltLAD in open pack Supine thomas stretch 30s 3x LTR 20x 2x10 step ups on both  Seated lumbar flexion stretch 10s 5x  HEP update  10/15  Mulligan R hip inferior and lateral mob grade III Supine thomas stretch 30s 3x Prone quad stretch 30s 3x Seated pirifromis stretch 30s 3x Standing hip flexor stretch (attempted) STS from knee height table 3x8  Edu of imaging, effects of OA on hip joint, self pain management, thermo therapy, conservative management vs THA  10/8: PROM R hip (guarding present) Manual HSS Hooklying adductor squeeze 5 x10 S/l clamshells 2x10 Sit to stands from  slightly elevated plinth 2x5 Sci fit bike  L3 HEP update/review    Eval: Pt performed S/L clams, supine bent knee fall outs, and supine bridging.  Pt received a HEP handout and was educated in correct form and appropriate frequency.  PT instructed pt he shouldn't have pain with HEP.    PATIENT EDUCATION:  Education details: relevant anatomy, objective findings, POC, dx, HEP, rationale of interventions.  PT answered pt's questions.  Person educated: Patient Education method: Explanation, Demonstration, Tactile cues, Verbal cues, and Handouts Education comprehension: verbalized understanding, returned demonstration, verbal cues required, tactile cues required, and needs further education  HOME EXERCISE PROGRAM: Access Code: [email or web address] URL: [email or web address] Date: 10/06/2023 Prepared by: Mose Minerva  Exercises - Bent Knee Fallouts  - 1 x daily - 7 x weekly - 2 sets - 10 reps - Supine Bridge  - 1 x daily - 7 x weekly - 2 sets - 10 reps  ASSESSMENT:  CLINICAL IMPRESSION: Pt is improving with sx's as evidenced by subjective reports.  Pt continues to have limitations with hip ER ROM though demonstrates improved ROM with S/L clams.  PT performed hip flexion and ER PROM to improve stiffness and functional ROM.  He tolerated exercises and PROM well.  He responded well to treatment having no pain and no c/o's after treatment.      OBJECTIVE IMPAIRMENTS: Abnormal gait, decreased activity tolerance, decreased balance, decreased mobility, difficulty walking, decreased ROM, hypomobility, impaired flexibility, and pain.   ACTIVITY LIMITATIONS: bending, sitting, standing, sleeping, stairs, transfers, dressing, and locomotion level  PARTICIPATION LIMITATIONS: community activity and yard work  PERSONAL FACTORS: 1 comorbidity: L hip OA are also affecting patient's functional outcome.   REHAB POTENTIAL: Good  CLINICAL DECISION MAKING: Stable/uncomplicated  EVALUATION  COMPLEXITY: Low   GOALS:   SHORT TERM GOALS: Target date:  10/27/2023  Pt will be independent and compliant with HEP for improved pain, ROM, strength, and function.  Baseline: Goal status: INITIAL  2.  Pt will report at least a 25% improvement in pain and sx's overall.  Baseline:  Goal status: INITIAL  3.  Pt will demo at least an 8 deg increase in bilat hip ER AROM for improved mobility and stiffness.  Baseline:  Goal status: INITIAL    LONG TERM GOALS: Target date: 11/17/2023  Pt will report he is able to sleep at least 5/7 nights per week without disturbed sleep.   Baseline:  Goal status: INITIAL  2.  Pt will report he is able to lift R LE without significant difficulty and perform transfers without difficulty.  Baseline:  Goal status: INITIAL  3.  Pt will report at least a 70% improvement in pain with his daily mobility.  Baseline:  Goal status: INITIAL  4.  Pt will report he is able to perform his normal ambulation and outdoor work without significant pain.  Baseline:  Goal status: INITIAL    PLAN:  PT FREQUENCY: 1-2x/week  PT DURATION: 6 weeks  PLANNED INTERVENTIONS: 97164- PT Re-evaluation, 97750- Physical Performance Testing, 97110-Therapeutic exercises, 97530- Therapeutic activity, [id number]- Neuromuscular re-education, 97535- Self Care, 02859- Manual therapy, [id number]- Gait training, [phone]- Aquatic Therapy, [phone]- Electrical stimulation (unattended), [phone]- Electrical stimulation (manual), [id number]- Ultrasound, 79439 (1-2 muscles), 20561 (3+ muscles)- Dry Needling, Patient/Family education, Balance training, Stair training, Taping, Cryotherapy, and Moist heat  PLAN FOR NEXT SESSION: assess HS and hip flexor tightness.  Review and perform HEP.  Strengthening, flexibility, and ROM.   Leigh Minerva III PT, DPT 11/05/23 11:53 AM

## 2023-11-10 ENCOUNTER — Encounter (HOSPITAL_BASED_OUTPATIENT_CLINIC_OR_DEPARTMENT_OTHER): Admitting: Physical Therapy

## 2023-11-16 ENCOUNTER — Other Ambulatory Visit (HOSPITAL_BASED_OUTPATIENT_CLINIC_OR_DEPARTMENT_OTHER): Payer: Self-pay | Admitting: Family Medicine

## 2023-11-17 ENCOUNTER — Ambulatory Visit (HOSPITAL_BASED_OUTPATIENT_CLINIC_OR_DEPARTMENT_OTHER): Attending: Family Medicine | Admitting: Physical Therapy

## 2023-11-17 DIAGNOSIS — M25551 Pain in right hip: Secondary | ICD-10-CM | POA: Insufficient documentation

## 2023-11-17 DIAGNOSIS — M25651 Stiffness of right hip, not elsewhere classified: Secondary | ICD-10-CM | POA: Diagnosis present

## 2023-11-17 NOTE — Therapy (Signed)
 OUTPATIENT PHYSICAL THERAPY LOWER EXTREMITY TREATMENT DISCHARGE   Patient Name: Kenneth Cantu MRN: [id number] DOB:1950/12/10, 73 y.o., male Today's Date: 11/18/2023  END OF SESSION:  PT End of Session - 11/17/23 1419     Visit Number 6    Number of Visits 6    Authorization Type [employer] MCR    PT Start Time 1316    PT Stop Time 1402    PT Time Calculation (min) 46 min    Activity Tolerance Patient tolerated treatment well    Behavior During Therapy WFL for tasks assessed/performed               Past Medical History:  Diagnosis Date   Labile hypertension    Mixed hyperlipidemia    Prediabetes 06/11/2014   Testosterone  deficiency 06/11/2014   Vitamin D  deficiency    Past Surgical History:  Procedure Laterality Date   APPENDECTOMY  1981   ORIF CLAVICLE FRACTURE Left 1970s   REFRACTIVE SURGERY Bilateral 2011   WRIST FRACTURE SURGERY Left 1970's   Patient Active Problem List   Diagnosis Date Noted   Wellness examination 08/20/2023   Elevated PSA 08/20/2023   Right hip pain 05/17/2023   Prediabetes 05/17/2023   Abnormal glucose (hx of prediabetes)  12/10/2017   TMJ (temporomandibular joint syndrome) 12/10/2017   Obesity (BMI 30.0-34.9) 06/08/2013   Hyperlipidemia, mixed    Labile hypertension    Vitamin D  deficiency     PCP: de [place], Quintin PARAS, MD  REFERRING PROVIDER: de [place], Raymond J, MD  REFERRING DIAG: [phone] (ICD-10-CM) - Right hip pain   THERAPY DIAG:  Pain in right hip  Stiffness of right hip, not elsewhere classified  Rationale for Evaluation and Treatment: Rehabilitation  ONSET DATE: PT order 08/23/2023  SUBJECTIVE:   SUBJECTIVE STATEMENT: Pt states last week was kind of tough.  He was in the car a lot and standing on concrete a lot also.  Pt states the exercises helped.  Pt had less pain than he typically does at this event.  It's a lot better.  Pt denies pain currently.  Pt denies any adverse effects after prior treatment.  Pt reports  compliance with HEP.   Pt reports an 85% improvement in pain and sx's overall and daily mobility.  Pt has improved sleep.  He is able to sleep 4-5 nights per week without pain.  Pt has much improved performance of car and tractor transfers.  Pt has improved ambulation and pain is not limiting his walking.  Pt can have missteps which causes pain though that has improved.  Pt able to climb ladders normally which he was not able to do prior.  Pt able to sit in tree stand.      PERTINENT HISTORY: X rays showed hip OA and decreased bone mineralization. L hip OA and pain L knee arthroscopic surgery L ORIF clavicle fx and wrist fx surgery in 1970's    PAIN:  NPRS:  0/10 current, 5/10 worst in R hip and 3-4/10 in L Location:  R > L anterior hip L hip doesn't bother him too much unless he missteps or jars it.   PRECAUTIONS: Other: decreased bone mineralization    WEIGHT BEARING RESTRICTIONS: No  FALLS:  Has patient fallen in last 6 months? Yes, once to dodge farm equipment  LIVING ENVIRONMENT: Lives with: significant other Lives in: 3 story home Stairs: yes with rail Has following equipment at home: cane and walking sticks  OCCUPATION: Retired  PLOF: Independent  PATIENT GOALS: to be pain free, to be able to walk outdoors   OBJECTIVE:  Note: Objective measures were completed at Evaluation unless otherwise noted.  DIAGNOSTIC FINDINGS: X rays of bilat hips on 05/22/23:   FINDINGS: There is diffuse decreased bone mineralization.   Severe right greater than left superomedial femoroacetabular joint space narrowing. Superomedial right femoral head 9 mm subchondral cyst. Moderate right greater than left femoral head-neck junction circumferential degenerative osteophytosis. Mild pubic symphysis and bilateral inferior sacroiliac joint space narrowing and subchondral sclerosis.   No acute fracture or dislocation.   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  PATIENT SURVEYS:  LEFS: 49/80  COGNITION: Overall cognitive status: Within functional limits for tasks assessed        PALPATION: No tenderness with palpation t/o R hip  LOWER EXTREMITY ROM:  Active ROM Right eval Left eval Right 11/12 Left 11/12  Hip flexion 88 98 102 106  Hip extension      Hip abduction      Hip adduction      Hip internal rotation 8 15 10 18   Hip external rotation 25 22 28 25   Knee flexion      Knee extension      Ankle dorsiflexion      Ankle plantarflexion      Ankle inversion      Ankle eversion       (Blank rows = not tested)   Pt unable to place either heel on contralateral knee in hooklying.   LOWER EXTREMITY MMT:  MMT Right eval Left eval  Hip flexion 5/5 5/5  Hip extension 5/5 5/5  Hip abduction 5/5 5/5  Hip adduction    Hip internal rotation    Hip external rotation 5/5 w/n available ROM 5/5 w/n available ROM  Knee flexion 5/5 seated 5/5 seated  Knee extension 5/5 5/5  Ankle dorsiflexion    Ankle plantarflexion    Ankle inversion    Ankle eversion     (Blank rows = not tested)  LOWER EXTREMITY SPECIAL TESTS:  Scour's Test:  negative bilat FABER's Test: no pain though very limited with ROM bilat   GAIT: Assistive device utilized: None Level of assistance: Complete Independence Comments: decreased hip mobility.  Pt demonstrates improved gait speed.                                                                                                                                TREATMENT:    11/12 PT assessed gait.  See above.  Nustep  lvl 5 6 min bilat UE/LE PT assessed hip AROM.  See above PT reviewed goals.   PT reviewed HEP.  Supine quad stretch with strap 2x30 sec bilat Reviewed and attempted piriformis stretch.  LEFS:  51/80    PATIENT EDUCATION:  Education details: relevant anatomy, objective findings, POC, goal progress, dx, HEP, and rationale of interventions.  PT  answered pt's questions.  Person educated: Patient Education method:  Explanation, Demonstration, Tactile cues, Verbal cues, and Handouts Education comprehension: verbalized understanding, returned demonstration, verbal cues required, tactile cues required  HOME EXERCISE PROGRAM: Access Code: [email or web address] URL: [email or web address] Date: 10/06/2023 Prepared by: Mose Minerva    ASSESSMENT:  CLINICAL IMPRESSION: Pt has made excellent progress in function as evidenced by subjective reports.  Pt reports an 85% improvement in pain and sx's overall and daily mobility.  Pt has much improved performance of car and tractor transfers.  Pt has improved ambulation and pain is not limiting his walking.   Pt had less pain than he typically does at an event last week.  Pt reports improved pain and sx's with his home exercises.  He is able to sleep 4-5 nights per week without pain.  Pt able to climb ladders normally which he was not able to do prior and he was also able to sit in a tree stand.  Pt demonstrates improved gait though continues to have decreased hip mobility.  Pt demonstrates improved bilat hip flexion AROM.  He continues to have limitations in hip rotation AROM and made minimal improvements.  PT reviewed HEP.  Pt is independent with HEP.  Pt met all of his goals except STG #3.  Pt is ready for discharge.     OBJECTIVE IMPAIRMENTS: Abnormal gait, decreased activity tolerance, decreased balance, decreased mobility, difficulty walking, decreased ROM, hypomobility, impaired flexibility, and pain.   ACTIVITY LIMITATIONS: bending, sitting, standing, sleeping, stairs, transfers, dressing, and locomotion level  PARTICIPATION LIMITATIONS: community activity and yard work  PERSONAL FACTORS: 1 comorbidity: L hip OA are also affecting patient's functional outcome.   REHAB POTENTIAL: Good  CLINICAL DECISION MAKING: Stable/uncomplicated  EVALUATION COMPLEXITY: Low   GOALS:   SHORT  TERM GOALS: Target date:  10/27/2023  Pt will be independent and compliant with HEP for improved pain, ROM, strength, and function.  Baseline: Goal status: GOAL MET 11/12  2.  Pt will report at least a 25% improvement in pain and sx's overall.  Baseline:  Goal status: GOAL MET  11/12  3.  Pt will demo at least an 8 deg increase in bilat hip ER AROM for improved mobility and stiffness.  Baseline:  Goal status:  IMPROVED, BUT NOT MET    LONG TERM GOALS: Target date: 11/17/2023  Pt will report he is able to sleep at least 5/7 nights per week without disturbed sleep.   Baseline:  Goal status: 90% MET   11/12  2.  Pt will report he is able to lift R LE without significant difficulty and perform transfers without difficulty.  Baseline:  Goal status: GOAL MET 11/12  3.  Pt will report at least a 70% improvement in pain with his daily mobility.  Baseline:  Goal status: GOAL MET  11/12  4.  Pt will report he is able to perform his normal ambulation and outdoor work without significant pain.  Baseline:  Goal status: GOAL MET  11/12    PLAN:  PT FREQUENCY: 1 visit  PT DURATION: 1 visit  PLANNED INTERVENTIONS: 97164- PT Re-evaluation, 97750- Physical Performance Testing, 97110-Therapeutic exercises, 97530- Therapeutic activity, [id number]- Neuromuscular re-education, 97535- Self Care, [id number]- Manual therapy, [phone]- Gait training, [phone]- Aquatic Therapy, [phone]- Electrical stimulation (unattended), [phone]- Electrical stimulation (manual), [id number]- Ultrasound, 79439 (1-2 muscles), 20561 (3+ muscles)- Dry Needling, Patient/Family education, Balance training, Stair training, Taping, Cryotherapy, and Moist heat  PLAN FOR NEXT SESSION:  Pt to be discharged from skilled PT due to meeting all  of his goals except STG #3.  Pt is independent with HEP and will cont with HEP.  He is agreeable with discharge.  PHYSICAL THERAPY DISCHARGE SUMMARY  Visits from Start of Care: 6  Current functional level  related to goals / functional outcomes: See above   Remaining deficits: See above   Education / Equipment: See above   Leigh Minerva III PT, DPT 11/18/23 8:25 PM

## 2023-11-18 ENCOUNTER — Encounter (HOSPITAL_BASED_OUTPATIENT_CLINIC_OR_DEPARTMENT_OTHER): Payer: Self-pay | Admitting: Physical Therapy

## 2023-11-23 ENCOUNTER — Ambulatory Visit (HOSPITAL_BASED_OUTPATIENT_CLINIC_OR_DEPARTMENT_OTHER): Admitting: Family Medicine

## 2023-11-23 VITALS — BP 155/87 | HR 58 | Ht 68.0 in | Wt 217.8 lb

## 2023-11-23 DIAGNOSIS — R972 Elevated prostate specific antigen [PSA]: Secondary | ICD-10-CM | POA: Diagnosis not present

## 2023-11-23 DIAGNOSIS — E782 Mixed hyperlipidemia: Secondary | ICD-10-CM

## 2023-11-23 NOTE — Assessment & Plan Note (Signed)
 Patient continues with atorvastatin  10 mg, denies any issues since starting medication.  Unfortunate, he did not have fasting lipid panel completed prior to today's visit.  He is not fasting currently. Patient will continue with current medication regimen.  He will return to have fasting lipid panel completed.  We will review and determine if any medication changes are needed and determine recommended follow-up after reviewing labs

## 2023-11-23 NOTE — Progress Notes (Signed)
    Procedures performed today:    None.  Independent interpretation of notes and tests performed by another provider:   None.  Brief History, Exam, Impression, and Recommendations:    BP (!) 155/87   Pulse (!) 58   Ht 5' 8 (1.727 m)   Wt 217 lb 12.8 oz (98.8 kg)   SpO2 97%   BMI 33.12 kg/m   Hyperlipidemia, mixed Assessment & Plan: Patient continues with atorvastatin  10 mg, denies any issues since starting medication.  Unfortunate, he did not have fasting lipid panel completed prior to today's visit.  He is not fasting currently. Patient will continue with current medication regimen.  He will return to have fasting lipid panel completed.  We will review and determine if any medication changes are needed and determine recommended follow-up after reviewing labs  Orders: -     Lipid panel  Elevated PSA Assessment & Plan: Patient did have evaluation with urology.  Urology did discuss options related to elevated PSA result.  Patient elected to proceed with monitoring with 52-month follow-up for repeat labs.  He denies any new symptoms or concerns   Return if symptoms worsen or fail to improve, for Will schedule after reviewing labs.   ___________________________________________ Jenisha Faison de [place], MD, ABFM, CAQSM Primary Care and Sports Medicine [facility]

## 2023-11-23 NOTE — Assessment & Plan Note (Signed)
 Patient did have evaluation with urology.  Urology did discuss options related to elevated PSA result.  Patient elected to proceed with monitoring with 8-month follow-up for repeat labs.  He denies any new symptoms or concerns

## 2023-11-23 NOTE — Patient Instructions (Signed)
  Medication Instructions:  Your physician recommends that you continue on your current medications as directed. Please refer to the Current Medication list given to you today. --If you need a refill on any your medications before your next appointment, please call your pharmacy first. If no refills are authorized on file call the office.-- Lab Work: Your physician has recommended that you have lab work today: none today, schedule in 1-2 weeks for fasting lab If you have labs (blood work) drawn today and your tests are completely normal, you will receive your results via MyChart message OR a phone call from our staff.  Please ensure you check your voicemail in the event that you authorized detailed messages to be left on a delegated number. If you have any lab test that is abnormal or we need to change your treatment, we will call you to review the results.  Referrals/Procedures/Imaging: none   Follow-Up: Your next appointment:   Your physician recommends that you schedule a follow-up appointment in: As needed with Dr. de [place], will determine follow up after lab results  You will receive a text message or e-mail with a link to a survey about your care and experience with us  today! We would greatly appreciate your feedback!   Thanks for letting us  be apart of your health journey!!  Primary Care and Sports Medicine   Dr. Quintin sheerer Cuba   We encourage you to activate your patient portal called MyChart.  Sign up information is provided on this After Visit Summary.  MyChart is used to connect with patients for Virtual Visits (Telemedicine).  Patients are able to view lab/test results, encounter notes, upcoming appointments, etc.  Non-urgent messages can be sent to your provider as well. To learn more about what you can do with MyChart, please visit --  [email or web address].

## 2023-11-24 ENCOUNTER — Encounter (HOSPITAL_BASED_OUTPATIENT_CLINIC_OR_DEPARTMENT_OTHER): Admitting: Physical Therapy

## 2023-11-24 LAB — LIPID PANEL
Chol/HDL Ratio: 3.9 ratio (ref 0.0–5.0)
Cholesterol, Total: 164 mg/dL (ref 100–199)
HDL: 42 mg/dL (ref >39)
LDL Chol Calc (NIH): 105 mg/dL — ABNORMAL HIGH (ref 0–99)
Triglycerides: 91 mg/dL (ref 0–149)
VLDL Cholesterol Cal: 17 mg/dL (ref 5–40)

## 2023-11-25 ENCOUNTER — Ambulatory Visit (HOSPITAL_BASED_OUTPATIENT_CLINIC_OR_DEPARTMENT_OTHER): Payer: Self-pay | Admitting: Family Medicine

## 2023-11-25 DIAGNOSIS — E782 Mixed hyperlipidemia: Secondary | ICD-10-CM

## 2023-12-01 ENCOUNTER — Encounter (HOSPITAL_BASED_OUTPATIENT_CLINIC_OR_DEPARTMENT_OTHER): Admitting: Physical Therapy

## 2023-12-13 ENCOUNTER — Other Ambulatory Visit (HOSPITAL_BASED_OUTPATIENT_CLINIC_OR_DEPARTMENT_OTHER): Payer: Self-pay | Admitting: Family Medicine

## 2024-01-07 ENCOUNTER — Other Ambulatory Visit (HOSPITAL_BASED_OUTPATIENT_CLINIC_OR_DEPARTMENT_OTHER): Payer: Self-pay | Admitting: Family Medicine

## 2024-01-13 ENCOUNTER — Other Ambulatory Visit (HOSPITAL_BASED_OUTPATIENT_CLINIC_OR_DEPARTMENT_OTHER): Payer: Self-pay | Admitting: Family Medicine

## 2024-01-19 ENCOUNTER — Telehealth: Payer: Self-pay | Admitting: *Deleted

## 2024-01-19 MED ORDER — ATORVASTATIN CALCIUM 20 MG PO TABS: 20.0000 mg | ORAL_TABLET | Freq: Every day | ORAL | 1 refills | Status: AC

## 2024-01-19 NOTE — Telephone Encounter (Signed)
 Copied from CRM [id number]. Topic: Clinical - Prescription Issue >> Jan 19, 2024 12:11 PM Nathanel BROCKS wrote: Reason for CRM: pt called and stated that Dr Everitt Cuba raised the dose on his atorvastatin  to 20 mg instead of the 10 mg. He took 2 of these pills a day and now he is out early. Please correct the rx and send in to the pharmacy for refill.  Please call pharmacy.

## 2024-01-19 NOTE — Telephone Encounter (Signed)
 Routing encounter to Dr. De Cuba for further review.

## 2024-02-03 ENCOUNTER — Other Ambulatory Visit (HOSPITAL_BASED_OUTPATIENT_CLINIC_OR_DEPARTMENT_OTHER): Payer: Self-pay | Admitting: Family Medicine
